# Patient Record
Sex: Male | Born: 1955 | Race: Black or African American | Hispanic: No | Marital: Married | Smoking: Never smoker
Health system: Southern US, Community
[De-identification: ages and names within clinical notes are randomized; demographics above are authoritative.]

## PROBLEM LIST (undated history)

## (undated) DIAGNOSIS — I10 Essential (primary) hypertension: Secondary | ICD-10-CM

## (undated) DIAGNOSIS — J189 Pneumonia, unspecified organism: Secondary | ICD-10-CM

## (undated) DIAGNOSIS — D860 Sarcoidosis of lung: Secondary | ICD-10-CM

## (undated) HISTORY — PX: BACK SURGERY: SHX140

## (undated) HISTORY — DX: Sarcoidosis of lung: D86.0

## (undated) HISTORY — DX: Essential (primary) hypertension: I10

## (undated) HISTORY — PX: EYE SURGERY: SHX253

---

## 2015-11-28 LAB — PULMONARY FUNCTION TEST

## 2016-09-28 ENCOUNTER — Emergency Department (HOSPITAL_COMMUNITY): Payer: 59

## 2016-09-28 ENCOUNTER — Encounter (HOSPITAL_COMMUNITY): Payer: Self-pay | Admitting: *Deleted

## 2016-09-28 ENCOUNTER — Emergency Department (HOSPITAL_COMMUNITY)
Admission: EM | Admit: 2016-09-28 | Discharge: 2016-09-28 | Disposition: A | Discharge status: Home / Self Care | Payer: 59 | Attending: Emergency Medicine | Admitting: Emergency Medicine

## 2016-09-28 DIAGNOSIS — R42 Dizziness and giddiness: Secondary | ICD-10-CM | POA: Diagnosis present

## 2016-09-28 HISTORY — DX: Pneumonia, unspecified organism: J18.9

## 2016-09-28 LAB — URINALYSIS, ROUTINE W REFLEX MICROSCOPIC
BILIRUBIN URINE: NEGATIVE
Glucose, UA: NEGATIVE mg/dL
HGB URINE DIPSTICK: NEGATIVE
KETONES UR: NEGATIVE mg/dL
Leukocytes, UA: NEGATIVE
NITRITE: NEGATIVE
Protein, ur: NEGATIVE mg/dL
SPECIFIC GRAVITY, URINE: 1.004 — AB (ref 1.005–1.030)
pH: 7 (ref 5.0–8.0)

## 2016-09-28 LAB — BASIC METABOLIC PANEL
Anion gap: 7 (ref 5–15)
BUN: 12 mg/dL (ref 6–20)
CALCIUM: 9.5 mg/dL (ref 8.9–10.3)
CHLORIDE: 105 mmol/L (ref 101–111)
CO2: 25 mmol/L (ref 22–32)
CREATININE: 1.34 mg/dL — AB (ref 0.61–1.24)
GFR calc Af Amer: >60 mL/min (ref >60)
GFR calc non Af Amer: 56 mL/min — ABNORMAL LOW (ref >60)
Glucose, Bld: 108 mg/dL — ABNORMAL HIGH (ref 65–99)
Potassium: 4.7 mmol/L (ref 3.5–5.1)
SODIUM: 137 mmol/L (ref 135–145)

## 2016-09-28 LAB — CBC WITH DIFFERENTIAL/PLATELET
BASOS ABS: 0.1 10*3/uL (ref 0.0–0.1)
BASOS PCT: 1 %
EOS ABS: 0.1 10*3/uL (ref 0.0–0.7)
Eosinophils Relative: 2 %
HCT: 43.6 % (ref 39.0–52.0)
HEMOGLOBIN: 13.6 g/dL (ref 13.0–17.0)
LYMPHS ABS: 1.6 10*3/uL (ref 0.7–4.0)
LYMPHS PCT: 27 %
MCH: 22.5 pg — AB (ref 26.0–34.0)
MCHC: 31.2 g/dL (ref 30.0–36.0)
MCV: 72.1 fL — ABNORMAL LOW (ref 78.0–100.0)
MONO ABS: 0.4 10*3/uL (ref 0.1–1.0)
Monocytes Relative: 6 %
NEUTROS ABS: 3.9 10*3/uL (ref 1.7–7.7)
Neutrophils Relative %: 64 %
PLATELETS: 188 10*3/uL (ref 150–400)
RBC: 6.05 MIL/uL — ABNORMAL HIGH (ref 4.22–5.81)
RDW: 14.3 % (ref 11.5–15.5)
WBC: 6.1 10*3/uL (ref 4.0–10.5)

## 2016-09-28 LAB — I-STAT TROPONIN, ED: TROPONIN I, POC: 0.02 ng/mL (ref 0.00–0.08)

## 2016-09-28 MED ORDER — LORAZEPAM 2 MG/ML IJ SOLN
1.0000 mg | Freq: Once | INTRAMUSCULAR | Status: AC
  Administered 2016-09-28: 1 mg via INTRAVENOUS
  Filled 2016-09-28: qty 1

## 2016-09-28 MED ORDER — DIPHENHYDRAMINE HCL 50 MG/ML IJ SOLN
25.0000 mg | Freq: Once | INTRAMUSCULAR | Status: AC
  Administered 2016-09-28: 25 mg via INTRAVENOUS
  Filled 2016-09-28: qty 1

## 2016-09-28 MED ORDER — METOCLOPRAMIDE HCL 5 MG/ML IJ SOLN
10.0000 mg | Freq: Once | INTRAMUSCULAR | Status: AC
  Administered 2016-09-28: 10 mg via INTRAVENOUS
  Filled 2016-09-28: qty 2

## 2016-09-28 MED ORDER — SODIUM CHLORIDE 0.9 % IV BOLUS (SEPSIS)
1000.0000 mL | Freq: Once | INTRAVENOUS | Status: AC
  Administered 2016-09-28: 1000 mL via INTRAVENOUS

## 2016-09-28 MED ORDER — PROCHLORPERAZINE EDISYLATE 5 MG/ML IJ SOLN
10.0000 mg | Freq: Once | INTRAMUSCULAR | Status: AC
  Administered 2016-09-28: 10 mg via INTRAVENOUS
  Filled 2016-09-28: qty 2

## 2016-09-28 MED ORDER — MECLIZINE HCL 25 MG PO TABS
25.0000 mg | ORAL_TABLET | Freq: Once | ORAL | Status: AC
  Administered 2016-09-28: 25 mg via ORAL
  Filled 2016-09-28: qty 1

## 2016-09-28 MED ORDER — MECLIZINE HCL 12.5 MG PO TABS: 12.5000 mg | ORAL_TABLET | Freq: Three times a day (TID) | ORAL | 0 refills | Status: DC | PRN

## 2016-09-28 MED ORDER — ONDANSETRON HCL 4 MG/2ML IJ SOLN
4.0000 mg | Freq: Once | INTRAMUSCULAR | Status: AC
  Administered 2016-09-28: 4 mg via INTRAVENOUS
  Filled 2016-09-28: qty 2

## 2016-09-28 NOTE — Discharge Instructions (Addendum)
Take meclizine as directed for nausea.   Take zofran as directed for nausea/vomiting.  Follow-up with your primary care doctor in 24-48 hours for further evaluation.   Follow-up with referred neurologist for further evaluation.   Return to the Emergency Department for any worsening dizziness, difficulty breathing, chest pain, difficulty walking, abdominal pain, nausea/vomiting or any other worsening or concerning symptoms.

## 2016-09-28 NOTE — ED Notes (Signed)
Patient transported to MRI 

## 2016-09-28 NOTE — ED Provider Notes (Signed)
[facility]-EMERGENCY DEPT Provider Note   CSN: [id number] Arrival date & time: 09/28/16  0754     History   Chief Complaint Chief Complaint  Patient presents with  . Dizziness    HPI Andrew Logan is a 61 y.o. male who presents with dizziness, nausea/vomiting that began and presently 5:30 this morning. Patient states that he woke up to go to the bathroom and when he attempted to ambulate, he felt like the room was spinning. Patient had a stop and lay down on the floor and help with symptoms. Patient states that dizziness was improved if he sat down and remain still and was worsened when he tried to move or ambulate. Patient reports that he started feeling a "uncomfortable sensation from his torso up" followed by an episode of vomiting and diarrhea. Patient reports that emesis was nonbloody and nonbilious. Diarrhea had no presence of blood. Patient denies chest pain preceding onset of symptoms. On ED arrival, patient states that symptoms have improved though he is still expressing some dizziness and "uncomfortable feeling "in his epigastric region. Patient reports that he is also having a slight headache. He states that he has had intermittent headaches for the last month which is abnormal for him. Patient denies any recent abnormal foods or sicknesses. Patient reports that he had been in his normal state of health prior to onset of symptoms. Patient does report that he took 25 mg of trazodone last night at around 12 AM prior to going to sleep. He has taken that before without any symptoms. Patient denies any recent heat exposure. Patient denies any fever or chills, chest pain, difficulty breathing, dysuria, hematuria, facial droop, slurred speech, numbness/weakness of his arms or legs.  The history is provided by the patient.    Past Medical History:  Diagnosis Date  . Pneumonia    greater than 3 years    There are no active problems to display for this patient.   Past Surgical History:    Procedure Laterality Date  . BACK SURGERY    . EYE SURGERY         Home Medications    Prior to Admission medications   Medication Sig Start Date End Date Taking? Authorizing Provider  meclizine (ANTIVERT) 12.5 MG tablet Take 1 tablet (12.5 mg total) by mouth 3 (three) times daily as needed for dizziness. 09/28/16   Maxwell CaulLayden, Vernal Hritz A, PA-C    Family History History reviewed. No pertinent family history.  Social History Social History  Substance Use Topics  . Smoking status: Never Smoker  . Smokeless tobacco: Never Used  . Alcohol use No     Allergies   Patient has no known allergies.   Review of Systems Review of Systems  Constitutional: Negative for chills and fever.  Eyes: Negative for visual disturbance.  Respiratory: Negative for cough and shortness of breath.   Cardiovascular: Negative for chest pain.  Gastrointestinal: Positive for abdominal pain, diarrhea, nausea and vomiting. Negative for blood in stool.  Genitourinary: Negative for dysuria and hematuria.  Neurological: Positive for dizziness and headaches. Negative for facial asymmetry, weakness and numbness.  All other systems reviewed and are negative.    Physical Exam Updated Vital Signs BP 139/74 (BP Location: Right Arm)   Pulse (!) 54   Temp 98.4 F (36.9 C)   Resp 16   SpO2 99%   Physical Exam  Constitutional: He is oriented to person, place, and time. He appears well-developed and well-nourished.  HENT:  Head: Normocephalic and atraumatic.  Mouth/Throat: Oropharynx is clear and moist and mucous membranes are normal.  Eyes: Pupils are equal, round, and reactive to light. Conjunctivae and lids are normal. Right eye exhibits nystagmus. Left eye exhibits nystagmus.  Horizontal nystagmus noted with movement to the right. No vertical or rotational nystagmus.  Neck: Full passive range of motion without pain. Neck supple. No neck rigidity.  Cardiovascular: Normal rate, regular rhythm, normal heart  sounds and normal pulses.  Exam reveals no gallop and no friction rub.   No murmur heard. Pulmonary/Chest: Effort normal and breath sounds normal.  Abdominal: Soft. Normal appearance. There is tenderness in the epigastric area. There is no rigidity and no guarding.  Abdomen soft, nondistended. Diffuse tenderness noted to the epigastric region.  Musculoskeletal: Normal range of motion.  Neurological: He is alert and oriented to person, place, and time. GCS eye subscore is 4. GCS verbal subscore is 5. GCS motor subscore is 6.  Cranial nerves III-XII intact Follows commands, Moves all extremities  5/5 strength to BUE and BLE  Sensation intact throughout all major nerve distributions Normal finger to nose. No dysdiadochokinesia. No pronator drift. No slurred speech. No facial droop.   Skin: Skin is warm and dry. Capillary refill takes less than 2 seconds.  Psychiatric: He has a normal mood and affect. His speech is normal.  Nursing note and vitals reviewed.    ED Treatments / Results  Labs (all labs ordered are listed, but only abnormal results are displayed) Labs Reviewed  BASIC METABOLIC PANEL - Abnormal; Notable for the following:       Result Value   Glucose, Bld 108 (*)    Creatinine, Ser 1.34 (*)    GFR calc non Af Amer 56 (*)    All other components within normal limits  CBC WITH DIFFERENTIAL/PLATELET - Abnormal; Notable for the following:    RBC 6.05 (*)    MCV 72.1 (*)    MCH 22.5 (*)    All other components within normal limits  URINALYSIS, ROUTINE W REFLEX MICROSCOPIC - Abnormal; Notable for the following:    Color, Urine STRAW (*)    Specific Gravity, Urine 1.004 (*)    All other components within normal limits  I-STAT TROPONIN, ED    EKG  EKG Interpretation  Date/Time:  Saturday September 28 2016 08:01:04 EDT Ventricular Rate:  54 PR Interval:    QRS Duration: 93 QT Interval:  440 QTC Calculation: 417 R Axis:   35 Text Interpretation:  Sinus rhythm No old  tracing to compare Confirmed by Melene Plan [phone]) on 09/28/2016 12:27:03 PM       Radiology Ct Head Wo Contrast  Result Date: 09/28/2016 CLINICAL DATA:  Slight headache with dizziness and generalized weakness. Fall without head injury. EXAM: CT HEAD WITHOUT CONTRAST TECHNIQUE: Contiguous axial images were obtained from the base of the skull through the vertex without intravenous contrast. COMPARISON:  None. FINDINGS: Brain: Ventricles, cisterns and other CSF spaces are within normal. There is no mass, mass effect, shift of midline structures or acute hemorrhage. There is mild chronic ischemic microvascular disease. Vascular: No hyperdense vessel or unexpected calcification. Skull: No evidence of fracture. Sinuses/Orbits: No acute finding. Other: None. IMPRESSION: No acute intracranial findings. Minimal chronic ischemic microvascular disease. Electronically Signed   By: Elberta Fortis M.D.   On: 09/28/2016 10:06   Mr Brain Wo Contrast  Result Date: 09/28/2016 CLINICAL DATA:  Dizziness beginning this morning. EXAM: MRI HEAD WITHOUT CONTRAST TECHNIQUE: Multiplanar, multiecho pulse sequences of the brain  and surrounding structures were obtained without intravenous contrast. COMPARISON:  CT head without contrast from the same day. FINDINGS: Brain: The diffusion-weighted images demonstrate no acute or subacute infarction. No acute hemorrhage or mass lesion is present. The ventricles are of normal size. Periventricular and subcortical white matter changes bilaterally are mildly advanced for age. The internal auditory canals are within normal limits bilaterally. The brainstem and cerebellum are normal. Vascular: Flow is present in the major intracranial arteries. Skull and upper cervical spine: The skullbase is within normal limits. Craniocervical junction is normal. Midline sagittal structures are unremarkable. Marrow signal is within normal limits. Sinuses/Orbits: The paranasal sinuses and mastoid air cells are  clear. The globes and orbits are within normal limits. IMPRESSION: 1. No acute intracranial abnormality. 2. Periventricular and subcortical white matter changes bilaterally are mildly advanced for age. The finding is nonspecific but can be seen in the setting of chronic microvascular ischemia, a demyelinating process such as multiple sclerosis, vasculitis, complicated migraine headaches, or as the sequelae of a prior infectious or inflammatory process. Electronically Signed   By: Marin Roberts M.D.   On: 09/28/2016 15:52    Procedures Procedures (including critical care time)  Medications Ordered in ED Medications  ondansetron (ZOFRAN) injection 4 mg (4 mg Intravenous Given 09/28/16 0925)  meclizine (ANTIVERT) tablet 25 mg (25 mg Oral Given 09/28/16 0925)  sodium chloride 0.9 % bolus 1,000 mL (0 mLs Intravenous Stopped 09/28/16 1700)  LORazepam (ATIVAN) injection 1 mg (1 mg Intravenous Given 09/28/16 1259)  diphenhydrAMINE (BENADRYL) injection 25 mg (25 mg Intravenous Given 09/28/16 1356)  metoCLOPramide (REGLAN) injection 10 mg (10 mg Intravenous Given 09/28/16 1358)  prochlorperazine (COMPAZINE) injection 10 mg (10 mg Intravenous Given 09/28/16 1400)     Initial Impression / Assessment and Plan / ED Course  I have reviewed the triage vital signs and the nursing notes.  Pertinent labs & imaging results that were available during my care of the patient were reviewed by me and considered in my medical decision making (see chart for details).     61 year old male who presents with dizziness, nausea/vomiting that began approximate 5:30 this morning. No history of chest pain or to call to breathing. Patient is afebrile, non-toxic appearing, sitting comfortably on examination table. Vital signs reviewed. Initial heart rate was 51. Initial blood pressure is slightly hypertensive. Repeat blood pressure shows improvement into the 130s systolic. No neuro deficits on exam. There is presence of  horizontal nystagmus. Consider vertigo versus dehydration. Low suspicion for CVA. History/physical exam or not concerning for aortic dissection. Also consider intracranial mass given recent history of intermittent headaches and current symptoms. Plan to check basic labs including CBC, BMP, UA, EKG. Will also obtain head CT for evaluation. Plan to check orthostatics. Plan to give IVF, analgesics and antiemetics in the department. Will try meclizine in the department to see if that improves symptoms.   Labs reviewed. Urinalysis is negative for any acute signs of infection. CBC is unremarkable. BMP shows creatinine of 1.34. No priors for comparison. I-STAT troponin is negative. EKG shows normal sinus rhythm. Discussed results with patient. Reevaluation. Patient reports some improvement in dizziness after meclizine but is still having some symptoms. CT head is pending. Orthostatics has not been above.   Orthostatic VS for the past 24 hrs:  BP- Lying Pulse- Lying BP- Sitting Pulse- Sitting BP- Standing at 0 minutes Pulse- Standing at 0 minutes  09/28/16 1032 130/68 53 142/77 58 162/85 61    CT head is negative  for any acute hemorrhage or mass. Discussed results with patient. Patient still feeling some dizziness that he was able to ambulate to the bathroom without difficulty. Will plan to give additional Ativan for further symptomatic control.  Patient ambulated in the department. Patient reports that he does experience some dizziness with ambulation. Initially gait was normal but as patient continues to walk he will veer off to the side when walking. Will plan to order MRI of brain for further evaluation. Will also give additional medications to help with dizziness. Discussed with patient. Patient family agree to plan.  MRI reviewed. Negative for any acute CVA. There is mention of some nonspecific white matter changes that are mildly advanced for age. Plan to give outpatient neurology referral for further  evaluation. Discussed results with patient and family. She reports improvement in dizziness after medication.  Patient ambulated in the department without difficulty or symptoms. Normal gait. No deviation. Normal heel to toe walking. She has been able to drink water without difficulty in the department. Patient is hemodynamically stable. Stable for discharge at this time. Will plan to treat symptomatically. Instructed patient to follow-up with PCP in 2 days. Strict return precautions discussed. Patient expresses understanding and agreement to plan.    Final Clinical Impressions(s) / ED Diagnoses   Final diagnoses:  Dizziness  Vertigo    New Prescriptions Discharge Medication List as of 09/28/2016  4:28 PM    START taking these medications   Details  meclizine (ANTIVERT) 12.5 MG tablet Take 1 tablet (12.5 mg total) by mouth 3 (three) times daily as needed for dizziness., Starting Sat 09/28/2016, Print         Maxwell Caul, PA-C 09/28/16 1725    Melene Plan, DO 09/29/16 [phone]

## 2016-09-28 NOTE — ED Notes (Signed)
Got patient undress on the monitor did ekg shown to dr Adela LankFloyd patient is resting with family at bedside with call bell in reach

## 2016-09-28 NOTE — ED Notes (Signed)
Walked patient to the bathroom got patient urine sample patient is resting with family at bedside call bell in reach

## 2016-09-28 NOTE — ED Triage Notes (Signed)
Pt reports taking 25 mg of  Trazodone after 12 midnight and when he  Woke up at 5:30  And  Was going to BR . Pt felt the room spinning and sat down on the floor. Pt also reported he  Vomited x2 . Pt felt like he was going to have diarrhea but did not. Pt reports he felt like the room was spinning.

## 2016-09-28 NOTE — ED Triage Notes (Signed)
Pt reports he is dizzy . Pt requesting to go to BR. Pt instructed he could not to BR while dizzy. BSC brought to room. Pt continue to ask to go to BR. Familly in room  With Pt while using BSC.

## 2016-10-07 ENCOUNTER — Encounter: Payer: Self-pay | Admitting: Neurology

## 2016-10-10 ENCOUNTER — Ambulatory Visit (INDEPENDENT_AMBULATORY_CARE_PROVIDER_SITE_OTHER): Payer: PRIVATE HEALTH INSURANCE | Admitting: Neurology

## 2016-10-10 ENCOUNTER — Encounter: Payer: Self-pay | Admitting: Neurology

## 2016-10-10 VITALS — BP 122/78 | HR 60 | Ht 73.0 in | Wt 230.0 lb

## 2016-10-10 DIAGNOSIS — R42 Dizziness and giddiness: Secondary | ICD-10-CM

## 2016-10-10 NOTE — Patient Instructions (Signed)
1. Schedule carotid ultrasound 2. Schedule echocardiogram 3. Refer to ENT for vertigo and possible hearing changes 4. Start daily aspirin 81mg  5. Continue to monitor BP, cholesterol, and sugar levels with Dr. Pecola Leisure (goal LDL <70) 6. If symptoms change, go to ER immediately. Follow-up in 6 months, call for any changes

## 2016-10-10 NOTE — Progress Notes (Signed)
NEUROLOGY CONSULTATION NOTE  Andrew Logan MRN: [id number] DOB: [birth year]  Referring provider: Dr. Melene Plan (ER) Primary care provider: Dr. Leilani Able  Reason for consult:  vertigo  Dear Dr Adela Lank:  Thank you for your kind referral of Andrew Logan for consultation of the above symptoms. Although his history is well known to you, please allow me to reiterate it for the purpose of our medical record. Records and images were personally reviewed where available.  HISTORY OF PRESENT ILLNESS: This is a pleasant 61 year old right-handed man with a history of pulmonary sarcoidosis, presenting after ER visit for vertigo. He reports a similar episode of vertigo in 1996 while he was in the doctor's office, he had a briefer episode of spinning that was less intense that the recent episode. On 09/28/16, he woke up to go to the bathroom, and as he tried to get up, the room was going in circles. He had to stop and lay on the floor for 15 minutes, and noticed that if remained still, dizziness got better. He felt an uncomfortable sensation from his abdomen going up the torso, crawled to the commode where he had a small amount of loose stool, then started vomiting. Symptoms lasted 30 minutes, he was not so much spinning when he got to the ER, but felt that his balance was not good. No speech difficulties or confusion. When he got home, he went to bed, and woke up the next morning feeling almost back to normal, able to drive all the way to [place]. He reports that he had an intense week prior, and a long day the day before. In the ER, exam had shown horizontal nystagmus with movement to the right, no focal abnormalities. Bloodwork was normal, EKG showed sinus rhythm. He was given meclizine, Benadryl, Reglan, and Compazine. He had an MRI brain without contrast with no acute changes. It was noted that there were white matter changes mildly advanced for age.   He denies any prior history of headaches, but over the  past year, he has been having occasional brief sharp mild headaches that dissipate quickly, over the frontal or occipital regions, no associated nausea/vomiting, photo/phonophobia. He denies any tinnitus or ear pressure, but sometimes wonders about hearing loss. He denies any diplopia, dysarthria/dysphagia, neck pain, focal numbness/tingling/weakness, bowel/bladder dysfunction. He has some low back pain.   I personally reviewed MRI brain without contrast which did not show any acute infarct. There was note of mild periventricular and white matter FLAIR changes bilaterally.  PAST MEDICAL HISTORY: Past Medical History:  Diagnosis Date  . Pneumonia    greater than 3 years    PAST SURGICAL HISTORY: Past Surgical History:  Procedure Laterality Date  . BACK SURGERY    . EYE SURGERY      MEDICATIONS: Current Outpatient Prescriptions on File Prior to Visit  Medication Sig Dispense Refill  . meclizine (ANTIVERT) 12.5 MG tablet Take 1 tablet (12.5 mg total) by mouth 3 (three) times daily as needed for dizziness. 30 tablet 0   No current facility-administered medications on file prior to visit.     ALLERGIES: No Known Allergies  FAMILY HISTORY: No family history on file.  SOCIAL HISTORY: Social History   Social History  . Marital status: Married    Spouse name: N/A  . Number of children: N/A  . Years of education: N/A   Occupational History  . Not on file.   Social History Main Topics  . Smoking status: Never Smoker  .  Smokeless tobacco: Never Used  . Alcohol use No  . Drug use: No  . Sexual activity: Not on file   Other Topics Concern  . Not on file   Social History Narrative  . No narrative on file    REVIEW OF SYSTEMS: Constitutional: No fevers, chills, or sweats, no generalized fatigue, change in appetite Eyes: No visual changes, double vision, eye pain Ear, nose and throat: No hearing loss, ear pain, nasal congestion, sore throat Cardiovascular: No chest pain,  palpitations Respiratory:  No shortness of breath at rest or with exertion, wheezes GastrointestinaI: No nausea, vomiting, diarrhea, abdominal pain, fecal incontinence Genitourinary:  No dysuria, urinary retention or frequency Musculoskeletal:  No neck pain, +back pain Integumentary: No rash, pruritus, skin lesions Neurological: as above Psychiatric: No depression, insomnia, anxiety Endocrine: No palpitations, fatigue, diaphoresis, mood swings, change in appetite, change in weight, increased thirst Hematologic/Lymphatic:  No anemia, purpura, petechiae. Allergic/Immunologic: no itchy/runny eyes, nasal congestion, recent allergic reactions, rashes  PHYSICAL EXAM: Vitals:   10/10/16 0911  BP: 122/78  Pulse: 60  SpO2: 96%   General: No acute distress Head:  Normocephalic/atraumatic Eyes: Fundoscopic exam shows bilateral sharp discs, no vessel changes, exudates, or hemorrhages Neck: supple, no paraspinal tenderness, full range of motion Back: No paraspinal tenderness Heart: regular rate and rhythm Lungs: Clear to auscultation bilaterally. Vascular: No carotid bruits. Skin/Extremities: No rash, no edema Neurological Exam: Mental status: alert and oriented to person, place, and time, no dysarthria or aphasia, Fund of knowledge is appropriate.  Recent and remote memory are intact.  Attention and concentration are normal.    Able to name objects and repeat phrases. Cranial nerves: CN I: not tested CN II: pupils equal, round and reactive to light, visual fields intact, fundi unremarkable. CN III, IV, VI:  full range of motion, no nystagmus, no ptosis CN V: facial sensation intact CN VII: upper and lower face symmetric CN VIII: hearing intact to finger rub CN IX, X: gag intact, uvula midline CN XI: sternocleidomastoid and trapezius muscles intact CN XII: tongue midline Bulk & Tone: normal, no fasciculations. Motor: 5/5 throughout with no pronator drift. Sensation: intact to light  touch, cold, pin, vibration and joint position sense.  No extinction to double simultaneous stimulation.  Romberg test negative Deep Tendon Reflexes: +2 throughout, no ankle clonus Plantar responses: downgoing bilaterally Cerebellar: no incoordination on finger to nose, heel to shin. No dysdiadochokinesia Gait: narrow-based and steady, able to tandem walk adequately. Tremor: none  IMPRESSION: This is a pleasant 61 year old right-handed man with a history of pulmonary sarcoidosis, presenting after a transient episode of vertigo with vomiting last 09/28/2016. Symptoms had resolved after 30 minutes, but he continued to feel some imbalance until he went to bed. MRI brain in the ER no acute changes. There was note of white matter changes bilaterally that appears due to chronic microvascular disease, however he denies any vascular risk factors, BP today 122/78. We discussed different causes of vertigo, majority of cases it is due to peripheral vertigo, but in this age group, posterior circulation TIA is also considered. TIA workup with carotid dopplers and echocardiogram will be ordered. He was advised to start a daily baby aspirin, and continue to monitor blood pressure, cholesterol (goal LDL <70), and glucose levels with PCP. He is also reporting some hearing changes, with vertigo, would refer to ENT for further evaluation as well. He will follow-up in 6 months and knows to go to the ER for any significant changes in  symptoms.   Thank you for allowing me to participate in the care of this patient. Please do not hesitate to call for any questions or concerns.   Patrcia Dolly, M.D.  CC: Dr. Pecola Leisure

## 2016-10-15 ENCOUNTER — Ambulatory Visit (HOSPITAL_COMMUNITY): Payer: PRIVATE HEALTH INSURANCE | Attending: Cardiology

## 2016-10-15 ENCOUNTER — Other Ambulatory Visit: Payer: Self-pay

## 2016-10-15 ENCOUNTER — Telehealth: Payer: Self-pay | Admitting: Neurology

## 2016-10-15 DIAGNOSIS — Z8673 Personal history of transient ischemic attack (TIA), and cerebral infarction without residual deficits: Secondary | ICD-10-CM | POA: Diagnosis not present

## 2016-10-15 DIAGNOSIS — R42 Dizziness and giddiness: Secondary | ICD-10-CM | POA: Insufficient documentation

## 2016-10-15 DIAGNOSIS — D860 Sarcoidosis of lung: Secondary | ICD-10-CM | POA: Insufficient documentation

## 2016-10-15 NOTE — Telephone Encounter (Signed)
Patient wants to check the status of the ENT referral please call

## 2016-10-15 NOTE — Telephone Encounter (Signed)
Referral placed again.

## 2016-10-16 ENCOUNTER — Telehealth: Payer: Self-pay | Admitting: Neurology

## 2016-10-16 DIAGNOSIS — R42 Dizziness and giddiness: Secondary | ICD-10-CM | POA: Insufficient documentation

## 2016-10-16 NOTE — Telephone Encounter (Signed)
Noted  

## 2016-10-16 NOTE — Telephone Encounter (Signed)
Dr Lajoyce Lauber office called to let you know PT is scheduled with Dr Joneen Roach on 10/28/2016 at 9:30

## 2016-10-17 ENCOUNTER — Other Ambulatory Visit: Payer: Self-pay

## 2016-10-17 ENCOUNTER — Telehealth: Payer: Self-pay

## 2016-10-17 DIAGNOSIS — R42 Dizziness and giddiness: Secondary | ICD-10-CM

## 2016-10-17 NOTE — Telephone Encounter (Signed)
Spoke with pt relaying message below.    He is interested in Dr. Rosalyn GessAquino's recommendations for Pulmonology?

## 2016-10-17 NOTE — Telephone Encounter (Signed)
-----   Message from Van ClinesKaren M Aquino, MD sent at 10/16/2016  1:08 PM EDT ----- Pls let him know heart looks good, thanks

## 2016-10-18 NOTE — Telephone Encounter (Signed)
If he is okay with going to [facility], any of them are good. Thanks

## 2016-10-18 NOTE — Telephone Encounter (Signed)
Spoke with pt relaying message below.   

## 2016-11-26 ENCOUNTER — Ambulatory Visit (INDEPENDENT_AMBULATORY_CARE_PROVIDER_SITE_OTHER)
Admission: RE | Admit: 2016-11-26 | Discharge: 2016-11-26 | Disposition: A | Discharge status: Home / Self Care | Payer: PRIVATE HEALTH INSURANCE | Source: Ambulatory Visit | Attending: Internal Medicine | Admitting: Internal Medicine

## 2016-11-26 ENCOUNTER — Encounter: Payer: Self-pay | Admitting: Internal Medicine

## 2016-11-26 ENCOUNTER — Ambulatory Visit (INDEPENDENT_AMBULATORY_CARE_PROVIDER_SITE_OTHER): Payer: PRIVATE HEALTH INSURANCE | Admitting: Internal Medicine

## 2016-11-26 VITALS — BP 122/90 | HR 55 | Ht 73.0 in | Wt 230.0 lb

## 2016-11-26 DIAGNOSIS — R05 Cough: Secondary | ICD-10-CM

## 2016-11-26 DIAGNOSIS — D860 Sarcoidosis of lung: Secondary | ICD-10-CM

## 2016-11-26 DIAGNOSIS — R058 Other specified cough: Secondary | ICD-10-CM

## 2016-11-26 MED ORDER — FAMOTIDINE 20 MG PO TABS: ORAL_TABLET | ORAL | Status: AC

## 2016-11-26 NOTE — Patient Instructions (Addendum)
Start pepcid 20 mg at bedtime until return to see if any of your morning congestion is due to silent noctural reflux   If am congestion is not better after 2 weeks > add to Pepcid >>>> CHLORPHENIRAMINE  4 mg - take one every 4 hours as needed - available over the counter- may cause drowsiness so start with just a bedtime dose or two    GERD (REFLUX)  is an extremely common cause of respiratory symptoms just like yours , many times with no obvious heartburn at all.    It can be treated with medication, but also with lifestyle changes including elevation of the head of your bed (ideally with 6 inch  bed blocks),  Smoking cessation, avoidance of late meals, excessive alcohol, and avoid fatty foods, chocolate, peppermint, colas, red wine, and acidic juices such as orange juice.  NO MINT OR MENTHOL PRODUCTS SO NO COUGH DROPS   USE SUGARLESS CANDY INSTEAD (Jolley ranchers or Stover's or Life Savers) or even ice chips will also do - the key is to swallow to prevent all throat clearing. NO OIL BASED VITAMINS - use powdered substitutes.   Please remember to go to the  x-ray department downstairs in the basement  for your tests - we will call you with the results when they are available.      Please schedule a follow up office visit in 4 weeks, sooner if needed with pfts.

## 2016-11-26 NOTE — Progress Notes (Signed)
Subjective:     Patient ID: Andrew Logan, male   DOB: June 11, 1955,   MRN: [id number]  HPI  58 yobm never smoker late 1990s developed cp while living in [place] > w/u with mediastinoscopy Pos for Sarcoid x 6 months prednisone  Though cp continued off and on during that time and was not related to pred dose used, then  Early 2000's developed breathing problems ? "fluid buiding up" seemed better p prednisone though required at least one thoracentesis ? On L side  then in 2006 living [place] wt loss/ fatigue > admit and rx mtx sev months at most  > better by 2007 and no flare off mtx but no chronic rx since then so referred to pulmonary clinic 11/26/2016 by Dr   Pecola Leisure  With cp and cough    11/26/2016 1st [facility] office visit/ Andrew Logan   Chief Complaint  Patient presents with  . Pulmonary Consult    Referred by Dr. Haskel Schroeder. 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 He c/o occ CP and cough in the am's with white to clear sputum.    onset of sensation of chest congestion/ cough x 10 y esp the last 3 y esp in am year round and assoc watery rhinitis  Does teadmill  30 min 3 x week @ 3.7 mph, no grade no sob Midline ant Cp is still comes and goes x once a month maybe lasts one minute not brought on by cough or ex and no assoc with meals he's aware of or assoc n or v or diaphoresis Cough congestion worse when using voice a lot or stressed/ ?  better on symbicort though does not take as maint    No obvious day to day or daytime variability or assoc excess/ purulent sputum or mucus plugs or hemoptysis or  chest tightness, subjective wheeze or overt hb symptoms. No unusual exp hx or h/o childhood pna/ asthma or knowledge of premature birth.  Sleeping ok on sides  without nocturnal  or early am exacerbation  of respiratory  c/o's or need for noct saba. Also denies any obvious fluctuation of symptoms with weather or environmental changes or other aggravating or  alleviating factors except as outlined above   Current Allergies, Complete Past Medical History, Past Surgical History, Family History, and Social History were reviewed in [employer] record.  ROS  The following are not active complaints unless bolded Hoarseness, sore throat, dysphagia, dental problems, itching, sneezing,  nasal congestion or discharge of excess mucus or purulent secretions, ear ache,   fever, chills, sweats, unintended wt loss or wt gain, classically pleuritic or exertional cp,  orthopnea pnd or leg swelling, presyncope, palpitations, abdominal pain, anorexia, nausea, vomiting, diarrhea  or change in bowel habits or change in bladder habits, change in stools or change in urine, dysuria, hematuria,  rash, arthralgias, visual complaints, headache, numbness, weakness or ataxia or problems with walking or coordination,  change in mood/affect or memory.        Current Meds  Medication Sig  . budesonide-formoterol (SYMBICORT) 160-4.5 MCG/ACT inhaler Inhale 2 puffs into the lungs 2 (two) times daily as needed.  . fluticasone (FLONASE) 50 MCG/ACT nasal spray Place 2 sprays into both nostrils daily as needed for allergies or rhinitis.                  Review of Systems     Objective:   Physical Exam  amb  bm nad   Wt Readings from Last 3 Encounters:  11/26/16 230 lb (104.3 kg)  10/10/16 230 lb (104.3 kg)    Vital signs reviewed   - Note on arrival 02 sats  99% on RA      HEENT: nl dentition, and oropharynx. Nl external ear canals without cough reflex- moderate bilateral non-specific turbinate edema     NECK :  without JVD/Nodes/TM/ nl carotid upstrokes bilaterally   LUNGS: no acc muscle use,  Nl contour chest which is clear to A and P bilaterally without cough on insp or exp maneuvers   CV:  RRR  no s3 or murmur or increase in P2, and no edema   ABD:  soft and nontender with nl inspiratory excursion in the supine position. No bruits or  organomegaly appreciated, bowel sounds nl  MS:  Nl gait/ ext warm without deformities, calf tenderness, cyanosis or clubbing No obvious joint restrictions   SKIN: warm and dry without lesions    NEURO:  alert, approp, nl sensorium with  no motor or cerebellar deficits apparent.     CXR PA and Lateral:   11/26/2016 :    I personally reviewed images and   impression as follows:   C/w burned out sarcoid though can't rule out active dz       Assessment:

## 2016-11-27 ENCOUNTER — Telehealth: Payer: Self-pay | Admitting: Internal Medicine

## 2016-11-27 DIAGNOSIS — R058 Other specified cough: Secondary | ICD-10-CM | POA: Insufficient documentation

## 2016-11-27 DIAGNOSIS — R05 Cough: Secondary | ICD-10-CM | POA: Insufficient documentation

## 2016-11-27 NOTE — Assessment & Plan Note (Signed)
Dx lated [id number] [facility] and complicated by ? Pleural effusions?  - maint pred stopped 2002?  - mtx for wt loss/ fatigue but only reported using for a few months PFT's 11/28/15 FVC  3.25 (66%) with dlco 84% and no obst / nl f/v curves   A good rule of thumb is that >95% of pts with active sarcoid in any organ will have some plain cxr changes as he clearly does -  on the other hand  if there are active pulmonary symptoms the cxr will look much worse than the patient:  No evidence of the latter scenario here/ strongly doubt active dz but will bring back in for f/u pfts for apples to apples comparison to previous  Total time devoted to counseling  > 50 % of initial 60 min office visit:  review extensive case hx dating back 20 years  with pt/ discussion of options/alternatives/ personally creating written customized instructions  in presence of pt  then going over those specific  Instructions directly with the pt including how to use all of the meds but in particular covering each new medication in detail and the difference between the maintenance= "automatic" meds and the prns using an action plan format for the latter (If this problem/symptom => do that organization reading Left to right).  Please see AVS from this visit for a full list of these instructions which I personally wrote for this pt and  are unique to this visit.

## 2016-11-27 NOTE — Assessment & Plan Note (Signed)
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   These are the same pts (now being labeled as having "irritable larynx syndrome" by some cough centers) who not infrequently have a history of having failed to tolerate ace inhibitors,  dry powder inhalers (and sometimes even hfa ICS like symbiocrt/ esp in high doses which he is on) or biphosphonates or report having atypical/extraesophageal reflux symptoms like atypical cp that don't respond to standard doses of PPI  and are easily confused as having aecopd or asthma flares by even experienced allergists/ pulmonologists (myself included).   rec max rx for gerd and pnds with 1st gen H1 blockers per guidelines  And only use symbicort if symptoms flare while on it.   No role here at all for systemic steroids for any reason   see avs for instructions unique to this ov

## 2016-11-27 NOTE — Telephone Encounter (Signed)
Spoke with patient. Advised that MW wanted him to start on pepcid, which is over the counter. Patient verbalized understanding. Nothing else needed at time of call.

## 2016-12-09 ENCOUNTER — Ambulatory Visit: Payer: PRIVATE HEALTH INSURANCE | Admitting: Neurology

## 2017-01-14 ENCOUNTER — Ambulatory Visit: Payer: PRIVATE HEALTH INSURANCE | Admitting: Internal Medicine

## 2017-02-05 ENCOUNTER — Encounter: Payer: Self-pay | Admitting: Internal Medicine

## 2017-02-10 ENCOUNTER — Ambulatory Visit: Payer: PRIVATE HEALTH INSURANCE | Admitting: Internal Medicine

## 2017-03-04 ENCOUNTER — Ambulatory Visit: Payer: 59 | Admitting: Internal Medicine

## 2017-03-04 ENCOUNTER — Encounter: Payer: Self-pay | Admitting: Internal Medicine

## 2017-03-04 ENCOUNTER — Ambulatory Visit (INDEPENDENT_AMBULATORY_CARE_PROVIDER_SITE_OTHER): Payer: 59 | Admitting: Internal Medicine

## 2017-03-04 ENCOUNTER — Other Ambulatory Visit (INDEPENDENT_AMBULATORY_CARE_PROVIDER_SITE_OTHER): Payer: 59

## 2017-03-04 VITALS — BP 128/80 | HR 66 | Ht 72.0 in | Wt 228.0 lb

## 2017-03-04 DIAGNOSIS — R05 Cough: Secondary | ICD-10-CM

## 2017-03-04 DIAGNOSIS — D860 Sarcoidosis of lung: Secondary | ICD-10-CM

## 2017-03-04 DIAGNOSIS — R058 Other specified cough: Secondary | ICD-10-CM

## 2017-03-04 DIAGNOSIS — J45991 Cough variant asthma: Secondary | ICD-10-CM

## 2017-03-04 LAB — PULMONARY FUNCTION TEST
DL/VA % PRED: 123 %
DL/VA: 5.82 ml/min/mmHg/L
DLCO COR % PRED: 71 %
DLCO cor: 24.97 ml/min/mmHg
DLCO unc % pred: 69 %
DLCO unc: 24.38 ml/min/mmHg
FEF 25-75 POST: 2.53 L/s
FEF 25-75 Pre: 1.83 L/sec
FEF2575-%CHANGE-POST: 38 %
FEF2575-%Pred-Post: 82 %
FEF2575-%Pred-Pre: 59 %
FEV1-%Change-Post: 10 %
FEV1-%Pred-Post: 71 %
FEV1-%Pred-Pre: 64 %
FEV1-POST: 2.41 L
FEV1-Pre: 2.18 L
FEV1FVC-%CHANGE-POST: 7 %
FEV1FVC-%Pred-Pre: 98 %
FEV6-%Change-Post: 2 %
FEV6-%PRED-PRE: 68 %
FEV6-%Pred-Post: 70 %
FEV6-PRE: 2.83 L
FEV6-Post: 2.91 L
FEV6FVC-%Pred-Post: 104 %
FEV6FVC-%Pred-Pre: 104 %
FVC-%CHANGE-POST: 2 %
FVC-%PRED-POST: 67 %
FVC-%Pred-Pre: 65 %
FVC-POST: 2.91 L
FVC-Pre: 2.83 L
POST FEV6/FVC RATIO: 100 %
PRE FEV1/FVC RATIO: 77 %
Post FEV1/FVC ratio: 83 %
Pre FEV6/FVC Ratio: 100 %
RV % pred: 66 %
RV: 1.57 L
TLC % PRED: 60 %
TLC: 4.49 L

## 2017-03-04 LAB — CBC WITH DIFFERENTIAL/PLATELET
Basophils Absolute: 0.1 10*3/uL (ref 0.0–0.1)
Basophils Relative: 0.9 % (ref 0.0–3.0)
EOS PCT: 4.8 % (ref 0.0–5.0)
Eosinophils Absolute: 0.3 10*3/uL (ref 0.0–0.7)
HEMATOCRIT: 42.1 % (ref 39.0–52.0)
Hemoglobin: 13 g/dL (ref 13.0–17.0)
LYMPHS ABS: 2.3 10*3/uL (ref 0.7–4.0)
LYMPHS PCT: 37.7 % (ref 12.0–46.0)
MCHC: 30.9 g/dL (ref 30.0–36.0)
MCV: 74.9 fl — AB (ref 78.0–100.0)
MONOS PCT: 9 % (ref 3.0–12.0)
Monocytes Absolute: 0.6 10*3/uL (ref 0.1–1.0)
NEUTROS ABS: 2.9 10*3/uL (ref 1.4–7.7)
NEUTROS PCT: 47.6 % (ref 43.0–77.0)
PLATELETS: 222 10*3/uL (ref 150.0–400.0)
RBC: 5.62 Mil/uL (ref 4.22–5.81)
RDW: 14.3 % (ref 11.5–15.5)
WBC: 6.1 10*3/uL (ref 4.0–10.5)

## 2017-03-04 MED ORDER — MOMETASONE FURO-FORMOTEROL FUM 100-5 MCG/ACT IN AERO: 2.0000 | INHALATION_SPRAY | Freq: Two times a day (BID) | RESPIRATORY_TRACT | 0 refills | Status: DC

## 2017-03-04 MED ORDER — MOMETASONE FURO-FORMOTEROL FUM 100-5 MCG/ACT IN AERO: 2.0000 | INHALATION_SPRAY | Freq: Two times a day (BID) | RESPIRATORY_TRACT | 11 refills | Status: DC

## 2017-03-04 NOTE — Progress Notes (Addendum)
Subjective:     Patient ID: Andrew Logan, male   DOB: 1955/12/04,   MRN: [id number]    History of Present Illness  22 yobm never smoker late 1990s developed cp while living in [place] > w/u with mediastinoscopy Pos for Sarcoid x 6 months prednisone  Though cp continued off and on during that time and was not related to pred dose used, then  Early 2000's developed breathing problems ? "fluid buiding up" seemed better p prednisone though required at least one thoracentesis ? On L side  then in 2006 living [place] wt loss/ fatigue > admit and  Dx sarcoid rx mtx sev months at most  > better by 2007 and no flare off mtx ? How long took it but  no chronic rx since then so referred to pulmonary clinic 11/26/2016 by Dr   Pecola Leisure  With  Main c/o cough x 2008 but worse since 2015 assoc with sense of chest congestions s excess mucus typical of uacs.   History of Present Illness  11/26/2016 1st [facility] office visit/ Andrew Logan   Chief Complaint  Patient presents with  . Pulmonary Consult    Referred by Dr. Haskel Schroeder. 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 He c/o occ CP and cough in the am's with white to clear sputum.    onset of sensation of chest congestion/ cough x 10 y esp the last 3 y esp in am year round and assoc watery rhinitis Does teadmill  30 min 3 x week @ 3.7 mph, no grade no sob Midline ant Cp is still comes and goes x once a month maybe lasts one minute not brought on by cough or ex and no assoc with meals he's aware of or assoc n or v or diaphoresis Cough congestion worse when using voice a lot or stressed/ ?  better on symbicort though does not take as maint  rec Start pepcid 20 mg at bedtime until return to see if any of your morning congestion is due to silent noctural reflux If am congestion is not better after 2 weeks > add to Pepcid >>>> CHLORPHENIRAMINE  4 mg - take one every 4 hours as needed - available over the counter- may cause  drowsiness so start with just a bedtime dose or two  GERD diet      03/04/2017  f/u ov/Andrew Logan re: cough x 10 y c/w uacs/  Sarcoid hx  Chief Complaint  Patient presents with  . Follow-up    PFT's done today. He states he developed a cold back in Dec 2018 and he has had some cough since then.  His cough is prod with yellow sputum.  He has been using the Symbicort 4-5 x per wk.     using symb just at hs but hfa very poor/ only uses 4-5 hs /week/ never started the h1 as rec and cough is mainly bothering him at hs  Not limited by breathing from desired activities    No obvious day to day or daytime variability or assoc excess/ purulent sputum or mucus plugs or hemoptysis or cp or chest tightness, subjective wheeze or overt sinus or hb symptoms. No unusual exposure hx or h/o childhood pna/ asthma or knowledge of premature birth.   . Also denies any obvious fluctuation of symptoms with weather or environmental changes or other aggravating or alleviating factors except as outlined above   Current Allergies, Complete Past Medical History, Past Surgical  History, Family History, and Social History were reviewed in [facility] electronic medical record.  ROS  The following are not active complaints unless bolded Hoarseness, sore throat, dysphagia, dental problems, itching, sneezing,  nasal congestion or discharge of excess mucus or purulent secretions, ear ache,   fever, chills, sweats, unintended wt loss or wt gain, classically pleuritic or exertional cp,  orthopnea pnd or leg swelling, presyncope, palpitations, abdominal pain, anorexia, nausea, vomiting, diarrhea  or change in bowel habits or change in bladder habits, change in stools or change in urine, dysuria, hematuria,  rash, arthralgias, visual complaints, headache, numbness, weakness or ataxia or problems with walking or coordination,  change in mood/affect or memory.        Current Meds  Medication Sig  . Cholecalciferol (VITAMIN D PO) Take  1 capsule by mouth daily.  . Cyanocobalamin (B-12 PO) Take 1 tablet by mouth daily.  . famotidine (PEPCID) 20 MG tablet One at bedtime  . fluticasone (FLONASE) 50 MCG/ACT nasal spray Place 2 sprays into both nostrils daily as needed for allergies or rhinitis.  . Multiple Vitamins-Minerals (MENS 50+ ADVANCED PO) Take 1 capsule by mouth daily.  . TURMERIC PO Take 1 capsule by mouth daily.  .   budesonide-formoterol (SYMBICORT) 160-4.5 MCG/ACT inhaler Inhale 2 puffs into the lungs 2 (two) times daily as needed.  .   Omega-3 1000 MG CAPS Take 1 capsule by mouth daily.               Objective:   Physical Exam  Pleasant amb bm nad    03/04/2017       228   11/26/16 230 lb (104.3 kg)  10/10/16 230 lb (104.3 kg)      Vital signs reviewed - Note on arrival 02 sats  96% on RA           HEENT: nl dentition,  and oropharynx. Nl external ear canals without cough reflex - mild bilateral non-specific turbinate edema     NECK :  without JVD/Nodes/TM/ nl carotid upstrokes bilaterally   LUNGS: no acc muscle use,  Nl contour chest which is clear to A and P bilaterally without cough on insp or exp maneuvers   CV:  RRR  no s3 or murmur or increase in P2, and no edema   ABD:  soft and nontender with nl inspiratory excursion in the supine position. No bruits or organomegaly appreciated, bowel sounds nl  MS:  Nl gait/ ext warm without deformities, calf tenderness, cyanosis or clubbing No obvious joint restrictions   SKIN: warm and dry without lesions    NEURO:  alert, approp, nl sensorium with  no motor or cerebellar deficits apparent.          Labs ordered 03/04/2017  Allergy profile          Assessment:

## 2017-03-04 NOTE — Patient Instructions (Addendum)
Continue  pepcid 20 mg at bedtime until return to see if any of your morning congestion is due to silent noctural reflux     Add  CHLORPHENIRAMINE  4 mg - take one every 4 hours as needed - available over the counter- may cause drowsiness so start with just a bedtime dose or two to see if helps your am congestions   If not better the next to add prilosec 20 mg Take 30-60 min before first meal of the day until return   Ok to take symbicort or dulera 100 2 pffs at bedtime if you really feel you can't do without it but Work on inhaler technique:  relax and gently blow all the way out then take a nice smooth deep breath back in, triggering the inhaler at same time you start breathing in.  Hold for up to 5 seconds if you can. Blow out thru nose. Rinse and gargle with water when done       GERD (REFLUX)  is an extremely common cause of respiratory symptoms just like yours , many times with no obvious heartburn at all.    It can be treated with medication, but also with lifestyle changes including elevation of the head of your bed (ideally with 6 inch  bed blocks),  Smoking cessation, avoidance of late meals, excessive alcohol, and avoid fatty foods, chocolate, peppermint, colas, red wine, and acidic juices such as orange juice.  NO MINT OR MENTHOL PRODUCTS SO NO COUGH DROPS   USE SUGARLESS CANDY INSTEAD (Jolley ranchers or Stover's or Life Savers) or even ice chips will also do - the key is to swallow to prevent all throat clearing. NO OIL BASED VITAMINS - use powdered substitutes - stop fish oil and eat more fish  Please remember to go to the lab department downstairs in the basement  for your tests - we will call you with the results when they are available.        Please schedule a follow up office visit in 4 weeks, sooner if needed  with all medications /inhalers/ solutions in hand so we can verify exactly what you are taking. This includes all medications from all doctors and over the  counters

## 2017-03-04 NOTE — Progress Notes (Signed)
PFT completed today 03/04/17  

## 2017-03-05 ENCOUNTER — Encounter: Payer: Self-pay | Admitting: Internal Medicine

## 2017-03-05 DIAGNOSIS — J45991 Cough variant asthma: Secondary | ICD-10-CM | POA: Insufficient documentation

## 2017-03-05 LAB — RESPIRATORY ALLERGY PROFILE REGION II ~~LOC~~
Allergen, A. alternata, m6: <0.1 kU/L
Allergen, Comm Silver Birch, t9: <0.1 kU/L
Allergen, D pternoyssinus,d7: <0.1 kU/L
Allergen, Mouse Urine Protein, e78: <0.1 kU/L
Allergen, Oak,t7: <0.1 kU/L
Aspergillus fumigatus, m3: <0.1 kU/L
Bermuda Grass: <0.1 kU/L
Box Elder IgE: <0.1 kU/L
CLASS: 0
CLASS: 0
CLASS: 0
CLASS: 0
CLASS: 0
CLASS: 0
CLASS: 0
CLASS: 0
CLASS: 0
CLASS: 0
COMMON RAGWEED (SHORT) (W1) IGE: <0.1 kU/L
Class: 0
Class: 0
Class: 0
Class: 0
Class: 0
Class: 0
Class: 0
Class: 0
Class: 0
Class: 0
Class: 0
Class: 0
Class: 0
Class: 0
Cockroach: <0.1 kU/L
D. farinae: <0.1 kU/L
Elm IgE: <0.1 kU/L
IgE (Immunoglobulin E), Serum: 37 kU/L (ref <=114)
Johnson Grass: <0.1 kU/L
Pecan/Hickory Tree IgE: <0.1 kU/L
Sheep Sorrel IgE: <0.1 kU/L
Timothy Grass: <0.1 kU/L

## 2017-03-05 LAB — INTERPRETATION:

## 2017-03-05 NOTE — Assessment & Plan Note (Addendum)
FENO 03/04/2017  =   Could not do - 03/04/2017  After extensive coaching inhaler device  effectiveness =    50% from a baseline of  0   rec  try dulera 100 2 bid if helps any of his symptoms and return with 4 weeks to consider MCT next to sort out if this is cough variant asthma or just all uacs (see separate a/p)    I had an extended discussion with the patient reviewing all relevant studies completed to date and  lasting 15 to 20 minutes of a 25 minute visit    Each maintenance medication was reviewed in detail including most importantly the difference between maintenance and prns and under what circumstances the prns are to be triggered using an action plan format that is not reflected in the computer generated alphabetically organized AVS.    Please see AVS for specific instructions unique to this visit that I personally wrote and verbalized to the the pt in detail and then reviewed with pt  by my nurse highlighting any  changes in therapy recommended at today's visit to their plan of care.

## 2017-03-05 NOTE — Assessment & Plan Note (Signed)
Sinus MRI  09/28/16 Sinuses/Orbits: The paranasal sinuses and mastoid air cells are clear.  Trial of gerd rx  11/26/2016 >>> some better 03/04/2017 - add 1st gen H1 blockers per guidelines  03/04/2017  - FENO 03/04/2017  =   Could not do  - Allergy profile 03/04/2017 >  Eos 0.3/  IgE pending   Lack of cough resolution on a verified empirical regimen(not able to confirm yet as did not follow recs)  could mean an alternative diagnosis, persistence of the disease state (eg sinusitis or bronchiectasis) , or inadequacy of currently available therapy (eg no medical rx available for non-acid gerd)   Still favor Upper airway cough syndrome (previously labeled PNDS),  is so named because it's frequently impossible to sort out how much is  CR/sinusitis with freq throat clearing (which can be related to primary GERD)   vs  causing  secondary (" extra esophageal")  GERD from wide swings in gastric pressure that occur with throat clearing, often  promoting self use of mint and menthol lozenges that reduce the lower esophageal sphincter tone and exacerbate the problem further in a cyclical fashion.   These are the same pts (now being labeled as having "irritable larynx syndrome" by some cough centers) who not infrequently have a history of having failed to tolerate ace inhibitors,  dry powder inhalers or biphosphonates or report having atypical/extraesophageal reflux symptoms that don't respond to standard doses of PPI  and are easily confused as having aecopd or asthma flares by even experienced allergists/ pulmonologists (myself included).  rec max rx for gerd/ add 1st gen H1 blockers per guidelines  And regroup in 4 weeks

## 2017-03-05 NOTE — Assessment & Plan Note (Signed)
Dx lated [phone] [facility] and complicated by ? Pleural effusions?  - maint pred stopped 2002?  - mtx for wt loss/ fatigue but only reported using for a few months around 2007  PFT's 11/28/15 FVC  3.25 (66%) with dlco 84% and no obst / nl f/v curves   He could have airways dz / cough variant asthma from sarcoid - see sep a/p but def no evidence of enough dz to warrant systemic pred at present  Discussed in detail all the  indications, usual  risks and alternatives  relative to the benefits with patient who agrees to proceed with conservative f/u as outlined

## 2017-03-06 NOTE — Progress Notes (Signed)
LMTCB

## 2017-03-13 ENCOUNTER — Ambulatory Visit: Payer: PRIVATE HEALTH INSURANCE | Admitting: Audiology

## 2017-03-20 ENCOUNTER — Ambulatory Visit: Payer: PRIVATE HEALTH INSURANCE | Admitting: Neurology

## 2017-03-24 ENCOUNTER — Ambulatory Visit: Payer: 59 | Attending: Audiology | Admitting: Audiology

## 2017-03-24 DIAGNOSIS — Z87898 Personal history of other specified conditions: Secondary | ICD-10-CM | POA: Diagnosis present

## 2017-03-24 DIAGNOSIS — H93299 Other abnormal auditory perceptions, unspecified ear: Secondary | ICD-10-CM | POA: Diagnosis present

## 2017-03-24 DIAGNOSIS — R2689 Other abnormalities of gait and mobility: Secondary | ICD-10-CM | POA: Diagnosis present

## 2017-03-24 DIAGNOSIS — H9193 Unspecified hearing loss, bilateral: Secondary | ICD-10-CM | POA: Diagnosis present

## 2017-03-24 NOTE — Procedures (Signed)
[facility] [address], [place] [address] Main: [phone] Fax: [phone]  AUDIOLOGICAL EVALUATION  NAME: Andrew Logan  DATE:   03/24/2017 DOB:  1955/06/30  REFERRAL:  Dizziness/Imbalance MRN:  [id number]  REFERENT:  Leilani Able, MD  CASE HISTORY Andrew Logan, a 62 y.o. male, was seen for an audiological evaluation at the request of Leilani Able, MD.  Today, Andrew Logan presented with complaints of hearing difficulties and imbalance.  He has noticed troubles with listening to and understanding his wife.  Andrew Logan believed that his hearing difficulties preceded his dizziness and imbalance.  He mentioned that his mother and father also have hearing difficulties.  He noted slight otalgia in the left ear from using a cotton-tipped applicator recently.  Andrew Logan was referred to Dr. Brion Aliment, otolaryngology for hearing difficulties; however, he decided not to follow up with the physician at the time.  Andrew Logan denied all of the following to the best of his knowledge:  tinnitus, loud noise exposure, aural pressure, otitis externa, ear surgery, head injury, diabetes, cancer, heart conditions, and otoxtoxic medications. Andrew Logan has a history of 2-3 episodes of dizziness and imbalance over the past 10+ years.   The most recent and severe episode of true vertigo, which has been treated by emergency medicine, neurology, and pulmonology, was after waking up in the middle of the night while walking to the bathroom in August 2018.  The room-spinning sensation lasted for approximately 30 minutes with accompanying nausea and vomiting.  His symptoms were exacerbated with movement.  However, Andrew Logan noted a relief from them when sitting and lying down.  Right-beating horizontal nystagmus in addition to normal gait with slight deviation to an unspecified direction was observed by emergency medicine.  Andrew Logan was prescribed Meclizine at the time.  As of now, Andrew Logan experiences slight  imbalance with movement in his day-to-day life.  He denied any recent room-spinning sensations.  A list of differential diagnoses has included acute cerebrovascular accident (CVA) and posterior circulation transient ischemic attack.  CVA has been ruled out with negative MRI and CT scans.  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  No other concerns were mentioned.  No pain was noted.  TEST PROCEDURES Tympanometry, conventional pure tone audiometry, and speech audiometry were administered.  TEST RESULTS Tympanometric values for middle ear volume, pressure, and compliance were within normal limits (Type A) in the right and left ears when compared to normative data.  This is consistent with normal middle ear function, bilaterally.  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  Normal hearing at the remaining frequencies.  Speech audiometry revealed speech reception thresholds using recorded spondee word lists at 15 dB and 5 dB in the right and left ears, respectively.  There is good agreement with the pure tone averages.  Suprathreshold word recognition scores in quiet using recorded NU-6 word lists were 96% at 55 dB HL and 92% at 45 dB HL in the right and left ears, respectively.  The scores are within the expected ranges when compared to the pure tone averages.  Speech-in-noise scores were 76% in the right ear and 88% in the left ear at +5 dB SNR.  The overall test reliability was judged to be good.  SUMMARY AND RECOMMENDATIONS Andrew Logan has primarily normal hearing with a slight sensorineural hearing loss at 3000Hz  - 4000Hz  with excellent word recognition in quiet. In minimal  background noise Andrew Logan continues to have excellent word recognition on the left side, but drops to fair on the left side. Some difficulty hearing in background noise is expected  in most social situations.   Together, we discussed common balance disorders, such as benign positional vertigo and vestibular neuritis, in addition to safety precautions.  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  Please call [phone] to schedule an appointment.   Also discussed with vestibular neuritis due to the isolated attack without any accompanying symptoms related to hearing loss or tinnitus at the time.  It is common for slight imbalance to last for months as the body learns to compensate after an attack.  Again, starting with the [facility] would be recommended for a vestibular evaluation (and vestibular therapy, if necessary).  However, if symptoms persist further evaluation that may require evaluation of ear-specific weakness from caloric responses may be needed.  Andrew Hedgesllen Logan was strongly encouraged to keep all of his appointments.  He will contact us with any questions and/or concerns.  Recommendations: 1) Referral to the [facility] for evaluation and possible treatment is recommended at neurorehabilitation at [address], [place], [place] [address].  Please call [phone] to schedule an appointment.   2)  Since Andrew Logan has a slight reduction in word recognition in background noise on the right side, for optimal hearing in background noise or when a competing message is present: 1) have conversation face to face and maintain eye contact 2) minimize background noise when having a conversation- turn off the TV, move to a quiet area of the area 3) be aware that auditory processing problems become worse with fatigue and stress so that extra vigilance may be needed to remain involved  with conversaton 4) Avoid having important conversation when Andrew Logan back is to the speaker. 5) avoid "multitasking" with electronic devices during conversation (i.eBoyd Logan. Converse without looking at phone, computer, video game, etc).  3)  Since vision is an important part of balance and Andrew Logan reports feeling unsteady when walking to the bathroom at night, keeping a pair of eye glasses, next to his bed, to wear while walking to the bathroom was recommended.  It was also recommended that the path to the bathroom be well lit at night.  4) If symptoms persist follow-up with ENT and/or caloric testing of vestibular function is recommended.  5)  Monitor hearing in 6-12 months, earlier if there are changes or concerns about hearing because Andrew Hedgesllen Shifrin is noticing early sign of hearing loss with only a slight hearing loss currently at 3000Hz  - 4000Hz  bilaterally.  6) Andrew HedgesAllen Sealey was encouraged to keep the vertigo medication prescribed with him in case of another event.  Hoyle SauerJacob Jahlani Lorentz, BA Graduate Student Clinician  Lewie Loroneborah Woodward, AuD, CCC-A Doctor of Audiology 03/24/2017

## 2017-03-25 NOTE — Procedures (Signed)
[facility] [address] [place] [address] Main: [phone] Fax: [phone]  AUDIOLOGICAL EVALUATION  NAME: [name]  DATE:   03/25/2017 DOB:  [birth year]  REFERRAL:  Dizziness/Imbalance MRN:  [id number]  REFERENT:  Leilani Able, MD  CASE HISTORY Reese Stockman, a 62 y.o. male, was seen for an audiological evaluation at the request of Leilani Able, MD.  Today, Freida Busman presented with complaints of hearing difficulties and imbalance.  He has noticed troubles with listening to and understanding his wife.  Yovan believed that his hearing difficulties preceded his dizziness and imbalance.  He mentioned that his mother and father also have hearing difficulties.  He noted slight otalgia in the left ear from using a cotton-tipped applicator recently.  Lautaro was referred to Dr. Brion Aliment, otolaryngology for hearing difficulties; however, he decided not to follow up with the physician at the time.  Landan denied all of the following to the best of his knowledge:  tinnitus, loud noise exposure, aural pressure, otitis externa, ear surgery, head injury, diabetes, cancer, heart conditions, and otoxtoxic medications. Donnavin has a history of 2-3 episodes of dizziness and imbalance over the past 10+ years.   The most recent and severe episode of true vertigo, which has been treated by emergency medicine, neurology, and pulmonology, was after waking up in the middle of the night while walking to the bathroom in August 2018.  The room-spinning sensation lasted for approximately 30 minutes with accompanying nausea and vomiting.  His symptoms were exacerbated with movement.  However, Talbot noted a relief from them when sitting and lying down.  Right-beating horizontal nystagmus in addition to normal gait with slight deviation to an unspecified direction was observed by emergency medicine.  Norwood was prescribed Meclizine at the time.  As of now, Stacey experiences slight  imbalance with movement in his day-to-day life.  He denied any recent room-spinning sensations.  A list of differential diagnoses has included acute cerebrovascular accident (CVA) and posterior circulation transient ischemic attack.  CVA has been ruled out with negative MRI and CT scans.  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  No other concerns were mentioned.  No pain was noted.  TEST PROCEDURES Tympanometry, conventional pure tone audiometry, and speech audiometry were administered.  TEST RESULTS Tympanometric values for middle ear volume, pressure, and compliance were within normal limits (Type A) in the right and left ears when compared to normative data.  This is consistent with normal middle ear function, bilaterally.  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  Normal hearing at the remaining frequencies.  Speech audiometry revealed speech reception thresholds using recorded spondee word lists at 15 dB and 5 dB in the right and left ears, respectively.  There is good agreement with the pure tone averages.  Suprathreshold word recognition scores in quiet using recorded NU-6 word lists were 96% at 55 dB HL and 92% at 45 dB HL in the right and left ears, respectively.  The scores are within the expected ranges when compared to the pure tone averages.  Speech-in-noise scores were 76% in the right ear and 88% in the left ear at +5 dB SNR.  The overall test reliability was judged to be good.  SUMMARY AND RECOMMENDATIONS Cassey Hurrell has primarily normal hearing with a slight sensorineural hearing loss at 3000Hz  - 4000Hz  with excellent word recognition in quiet. In minimal  background noise Freida Busmanllen continues to have excellent word recognition on the left side, but drops to fair on the  right side. Some difficulty hearing in background noise is  expected in most social situations.   Together, we discussed common balance disorders, such as benign positional vertigo and vestibular neuritis, in addition to safety precautions.  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  Please call [phone] to schedule an appointment.   Also discussed with vestibular neuritis due to the isolated attack without any accompanying symptoms related to hearing loss or tinnitus at the time.  It is common for slight imbalance to last for months as the body learns to compensate after an attack.  Again, starting with the [facility] would be recommended for a vestibular evaluation (and vestibular therapy, if necessary).  However, if symptoms persist further evaluation that may require evaluation of ear-specific weakness from caloric responses may be needed.  Linda Hedgesllen Zurcher was strongly encouraged to keep all of his appointments.  He will contact us with any questions and/or concerns.  Recommendations: 1) Referral to the [facility] for evaluation and possible treatment is recommended at neurorehabilitation at [address], [place], [place] [address].  Please call [phone] to schedule an appointment.   2)  Since Linda HedgesAllen Ormiston has a slight reduction in word recognition in background noise on the right side, for optimal hearing in background noise or when a competing message is present: 1) have conversation face to face and maintain eye contact 2) minimize background noise when having a conversation- turn off the TV, move to a quiet area of the area 3) be aware that auditory processing problems become worse with fatigue and stress so that extra vigilance may be needed to remain  involved with conversaton 4) Avoid having important conversation when Freida Busmanllen 's back is to the speaker. 5) avoid "multitasking" with electronic devices during conversation (i.eBoyd Kerbs. Converse without looking at phone, computer, video game, etc).  3)  Since vision is an important part of balance and Freida Busmanllen reports feeling unsteady when walking to the bathroom at night, keeping a pair of eye glasses, next to his bed, to wear while walking to the bathroom was recommended.  It was also recommended that the path to the bathroom be well lit at night.  4) If symptoms persist follow-up with ENT and/or caloric testing of vestibular function is recommended.  5)  Monitor hearing in 6-12 months, earlier if there are changes or concerns about hearing because Linda Hedgesllen Rundquist is noticing early sign of hearing loss with only a slight hearing loss currently at 3000Hz  - 4000Hz  bilaterally.  6) Linda HedgesAllen Teodoro was encouraged to keep the vertigo medication prescribed with him in case of another event.  Hoyle SauerJacob Buccini, BA Graduate Student Clinician  Lewie Loroneborah Woodward, AuD, CCC-A Doctor of Audiology 03/25/2017

## 2017-04-01 ENCOUNTER — Encounter: Payer: Self-pay | Admitting: Internal Medicine

## 2017-04-01 ENCOUNTER — Ambulatory Visit: Payer: 59 | Admitting: Internal Medicine

## 2017-04-01 DIAGNOSIS — J45991 Cough variant asthma: Secondary | ICD-10-CM | POA: Diagnosis not present

## 2017-04-01 DIAGNOSIS — R058 Other specified cough: Secondary | ICD-10-CM

## 2017-04-01 DIAGNOSIS — R05 Cough: Secondary | ICD-10-CM

## 2017-04-01 NOTE — Patient Instructions (Addendum)
For drainage / throat tickle try take CHLORPHENIRAMINE  4 mg - take one every 4 hours as needed - available over the counter- may cause drowsiness so start with just a bedtime dose or two and see how you tolerate it before trying in daytime    If not better then add prilosec 20 mg  Take 30-60 min before first meal of the day x 4 week trial     If still not better then return with all medications in hand

## 2017-04-01 NOTE — Progress Notes (Signed)
Subjective:     Patient ID: Andrew Logan, male   DOB: November 09, 1955,   MRN: [id number]    History of Present Illness  28 yobm never smoker late 1990s developed cp while living in [place] > w/u with mediastinoscopy Pos for Sarcoid x 6 months prednisone  Though cp continued off and on during that time and was not related to pred dose used, then  Early 2000's developed breathing problems ? "fluid buiding up" seemed better p prednisone though required at least one thoracentesis ? On L side  then in 2006 living [place] wt loss/ fatigue > admit and  Dx sarcoid rx mtx sev months at most  > better by 2007 and no flare off mtx ? How long took it but  no chronic rx since then so referred to pulmonary clinic 11/26/2016 by Dr   Pecola Leisure  With  Main c/o cough x 2008 but worse since 2015 assoc with sense of chest congestions s excess mucus typical of uacs.   History of Present Illness  11/26/2016 1st [facility] office visit/ Andrew Logan   Chief Complaint  Patient presents with  . Pulmonary Consult    Referred by Dr. Haskel Schroeder. 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 He c/o occ CP and cough in the am's with white to clear sputum.    onset of sensation of chest congestion/ cough x 10 y esp the last 3 y esp in am year round and assoc watery rhinitis Does teadmill  30 min 3 x week @ 3.7 mph, no grade no sob Midline ant Cp is still comes and goes x once a month maybe lasts one minute not brought on by cough or ex and no assoc with meals he's aware of or assoc n or v or diaphoresis Cough congestion worse when using voice a lot or stressed/ ?  better on symbicort though does not take as maint  rec Start pepcid 20 mg at bedtime until return to see if any of your morning congestion is due to silent noctural reflux If am congestion is not better after 2 weeks > add to Pepcid >>>> CHLORPHENIRAMINE  4 mg - take one every 4 hours as needed - available over the counter- may cause  drowsiness so start with just a bedtime dose or two  GERD diet      03/04/2017  f/u ov/Andrew Logan re: cough x 10 y c/w uacs/  Sarcoid hx  Chief Complaint  Patient presents with  . Follow-up    PFT's done today. He states he developed a cold back in Dec 2018 and he has had some cough since then.  His cough is prod with yellow sputum.  He has been using the Symbicort 4-5 x per wk.     using symb just at hs but hfa very poor/ only uses 4-5 hs /week/ never started the h1 as rec and cough is mainly bothering him at hs rec Continue  pepcid 20 mg at bedtime until return to see if any of your morning congestion is due to silent noctural reflux  Add  CHLORPHENIRAMINE  4 mg - take one every 4 hours as needed - available over the counter- may cause drowsiness so start with just a bedtime dose or two to see if helps your am congestions If not better the next to add prilosec 20 mg Take 30-60 min before first meal of the day until return  Ok to take symbicort or dulera  100 2 pffs at bedtime GERD diet   Please schedule a follow up office visit in 4 weeks, sooner if needed  with all medications /inhalers/ solutions in hand so we can verify exactly what you are taking. This includes all medications from all doctors and over the counters    04/01/2017  f/u ov/Andrew Logan re: cough x 10 y/ added h2 hs but did not use h1 as directed or add ppi as rec Chief Complaint  Patient presents with  . Follow-up    He is coughing less. He is using Symbicort 4-5 x per wk on average.    Dyspnea:  MMRC1 = can walk nl pace, flat grade, can't hurry or go uphills or steps s sob   Cough: mostly in am's , 4 good coughs and clear it:  Mucoid only,< 1 tsp Sleep: poorly / insomnia  No obvious day to day or daytime variability or assoc excess/ purulent sputum or mucus plugs or hemoptysis or cp or chest tightness, subjective wheeze or overt sinus or hb symptoms. No unusual exposure hx or h/o childhood pna/ asthma or knowledge of premature  birth.  Sleeping ok flat without nocturnal  or early am exacerbation  of respiratory  c/o's or need for noct saba. Also denies any obvious fluctuation of symptoms with weather or environmental changes or other aggravating or alleviating factors except as outlined above   Current Allergies, Complete Past Medical History, Past Surgical History, Family History, and Social History were reviewed in [employer] record.  ROS  The following are not active complaints unless bolded Hoarseness, sore throat, dysphagia, dental problems, itching, sneezing,  nasal congestion or discharge of excess mucus or purulent secretions, ear ache,   fever, chills, sweats, unintended wt loss or wt gain, classically pleuritic or exertional cp,  orthopnea pnd or leg swelling, presyncope, palpitations, abdominal pain, anorexia, nausea, vomiting, diarrhea  or change in bowel habits or change in bladder habits, change in stools or change in urine, dysuria, hematuria,  rash, arthralgias, visual complaints, headache, numbness, weakness or ataxia or problems with walking or coordination,  change in mood/affect or memory.        Current Meds  Medication Sig  . budesonide-formoterol (SYMBICORT) 80-4.5 MCG/ACT inhaler Inhale 2 puffs into the lungs 2 (two) times daily as needed.  . Cholecalciferol (VITAMIN D PO) Take 1 capsule by mouth daily.  . Cyanocobalamin (B-12 PO) Take 1 tablet by mouth daily.  . famotidine (PEPCID) 20 MG tablet One at bedtime  . fluticasone (FLONASE) 50 MCG/ACT nasal spray Place 2 sprays into both nostrils daily as needed for allergies or rhinitis.  . Multiple Vitamins-Minerals (MENS 50+ ADVANCED PO) Take 1 capsule by mouth daily.  . TURMERIC PO Take 1 capsule by mouth daily.                       Objective:   Physical Exam   amb somber bm nad    04/01/2017       230  03/04/2017       228   11/26/16 230 lb (104.3 kg)  10/10/16 230 lb (104.3 kg)     Vital signs  reviewed - Note on arrival 02 sats  100% on RA           HEENT: nl dentition,  and oropharynx. Nl external ear canals without cough reflex - mild bilateral non-specific turbinate edema     HEENT: nl dentition, and oropharynx. Nl external ear canals  without cough reflex- mild bilateral non-specific turbinate edema     NECK :  without JVD/Nodes/TM/ nl carotid upstrokes bilaterally   LUNGS: no acc muscle use,  Nl contour chest which is clear to A and P bilaterally without cough on insp or exp maneuvers   CV:  RRR  no s3 or murmur or increase in P2, and no edema   ABD:  soft and nontender with nl inspiratory excursion in the supine position. No bruits or organomegaly appreciated, bowel sounds nl  MS:  Nl gait/ ext warm without deformities, calf tenderness, cyanosis or clubbing No obvious joint restrictions   SKIN: warm and dry without lesions    NEURO:  alert, approp, nl sensorium with  no motor or cerebellar deficits apparent.                Assessment:

## 2017-04-03 ENCOUNTER — Encounter: Payer: Self-pay | Admitting: Internal Medicine

## 2017-04-03 NOTE — Assessment & Plan Note (Addendum)
PFT's 11/28/15 FVC  3.25 (66%) with dlco 84% and no obst / nl f/v curves  FENO 03/04/2017  =   Could not do - 03/04/2017  After extensive coaching inhaler device  effectiveness =    50% from a baseline of  0 > try dulera 100 2 bid if helps any of his symptoms > ? Improved > changed to symb 80 2bid by insurance    still not really clear whether this is asthma or not but if it is it is mild and intermittent  Based on the study from NEJM  378; 20 p 1865 (2018) in pts with mild asthma it is reasonable to use low dose symbicort eg 80 2bid "prn" flare in this setting but I emphasized this was only shown with symbicort and takes advantage of the rapid onset of action but is not the same as "rescue therapy" but can be stopped once the acute symptoms have resolved and the need for rescue has been minimized (< 2 x weekly)

## 2017-04-08 ENCOUNTER — Ambulatory Visit: Payer: 59 | Admitting: Neurology

## 2017-04-08 ENCOUNTER — Encounter: Payer: Self-pay | Admitting: Neurology

## 2017-04-08 VITALS — BP 124/70 | HR 64 | Ht 73.0 in | Wt 232.4 lb

## 2017-04-08 DIAGNOSIS — R42 Dizziness and giddiness: Secondary | ICD-10-CM

## 2017-04-08 NOTE — Patient Instructions (Signed)
Great seeing you! Proceed with balance PT recommended by audiology. Follow-up as needed, call for any changes.

## 2017-04-08 NOTE — Progress Notes (Signed)
NEUROLOGY FOLLOW UP OFFICE NOTE  Andrew Logan [id number] 1955/08/15  HISTORY OF PRESENT ILLNESS: I had the pleasure of seeing Andrew Logan in follow-up in the neurology clinic on 04/08/2017.  The patient was last seen 6 months ago after an episode of vertigo last August 2018. Records and images were personally reviewed where available.  MRI brain did not show any acute changes, there was mild chronic microvascular disease. He had an echo done which was normal, EF 55-60%, left atrium normal. He had an audiogram which was normal, he reports that he is awaiting schedule for "balance PT." He denies any further significant vertigo, but still has brief 2-3 seconds of imbalance when he gets up every now and then. No nausea/vomiting, no focal numbness/tingling/weakness, no falls. He has been having some sharp pains on the left temporal region which he feels may be due to sinus issues. No vision changes.   HPI 10/10/2016:  This is a pleasant 62 year old right-handed man with a history of pulmonary sarcoidosis, presenting after ER visit for vertigo. He reports a similar episode of vertigo in 1996 while he was in the doctor's office, he had a briefer episode of spinning that was less intense that the recent episode. On 09/28/16, he woke up to go to the bathroom, and as he tried to get up, the room was going in circles. He had to stop and lay on the floor for 15 minutes, and noticed that if remained still, dizziness got better. He felt an uncomfortable sensation from his abdomen going up the torso, crawled to the commode where he had a small amount of loose stool, then started vomiting. Symptoms lasted 30 minutes, he was not so much spinning when he got to the ER, but felt that his balance was not good. No speech difficulties or confusion. When he got home, he went to bed, and woke up the next morning feeling almost back to normal, able to drive all the way to [place]. He reports that he had an intense week prior,  and a long day the day before. In the ER, exam had shown horizontal nystagmus with movement to the right, no focal abnormalities. Bloodwork was normal, EKG showed sinus rhythm. He was given meclizine, Benadryl, Reglan, and Compazine. He had an MRI brain without contrast with no acute changes. It was noted that there were white matter changes mildly advanced for age.   He denies any prior history of headaches, but over the past year, he has been having occasional brief sharp mild headaches that dissipate quickly, over the frontal or occipital regions, no associated nausea/vomiting, photo/phonophobia. He denies any tinnitus or ear pressure, but sometimes wonders about hearing loss. He denies any diplopia, dysarthria/dysphagia, neck pain, focal numbness/tingling/weakness, bowel/bladder dysfunction. He has some low back pain.   I personally reviewed MRI brain without contrast which did not show any acute infarct. There was note of mild periventricular and white matter FLAIR changes bilaterally  PAST MEDICAL HISTORY: Past Medical History:  Diagnosis Date  . Pneumonia    greater than 3 years  . Pulmonary sarcoidosis [facility])     MEDICATIONS: Current Outpatient Medications on File Prior to Visit  Medication Sig Dispense Refill  . budesonide-formoterol (SYMBICORT) 80-4.5 MCG/ACT inhaler Inhale 2 puffs into the lungs 2 (two) times daily as needed.    . Cholecalciferol (VITAMIN D PO) Take 1 capsule by mouth daily.    . Cyanocobalamin (B-12 PO) Take 1 tablet by mouth daily.    . famotidine (  PEPCID) 20 MG tablet One at bedtime    . fluticasone (FLONASE) 50 MCG/ACT nasal spray Place 2 sprays into both nostrils daily as needed for allergies or rhinitis.    . Multiple Vitamins-Minerals (MENS 50+ ADVANCED PO) Take 1 capsule by mouth daily.    . TURMERIC PO Take 1 capsule by mouth daily.     No current facility-administered medications on file prior to visit.     ALLERGIES: No Known Allergies  FAMILY  HISTORY: Family History  Problem Relation Age of Onset  . Prostate cancer Maternal Grandfather   . Sarcoidosis Maternal Uncle     SOCIAL HISTORY: Social History   Socioeconomic History  . Marital status: Married    Spouse name: Not on file  . Number of children: Not on file  . Years of education: Not on file  . Highest education level: Not on file  Social Needs  . Financial resource strain: Not on file  . Food insecurity - worry: Not on file  . Food insecurity - inability: Not on file  . Transportation needs - medical: Not on file  . Transportation needs - non-medical: Not on file  Occupational History  . Not on file  Tobacco Use  . Smoking status: Never Smoker  . Smokeless tobacco: Never Used  Substance and Sexual Activity  . Alcohol use: No  . Drug use: No  . Sexual activity: Not on file  Other Topics Concern  . Not on file  Social History Narrative   Andrew Logan is a [employer] who has recently relocated to [place], [place].   He lives in a 2 story home with his wife and has 2 adult children who do not live in the home   He has his Master's Degree in [employer].     REVIEW OF SYSTEMS: Constitutional: No fevers, chills, or sweats, no generalized fatigue, change in appetite Eyes: No visual changes, double vision, eye pain Ear, nose and throat: No hearing loss, ear pain, nasal congestion, sore throat Cardiovascular: No chest pain, palpitations Respiratory:  No shortness of breath at rest or with exertion, wheezes GastrointestinaI: No nausea, vomiting, diarrhea, abdominal pain, fecal incontinence Genitourinary:  No dysuria, urinary retention or frequency Musculoskeletal:  No neck pain, back pain Integumentary: No rash, pruritus, skin lesions Neurological: as above Psychiatric: No depression, insomnia, anxiety Endocrine: No palpitations, fatigue, diaphoresis, mood swings, change in appetite, change in weight, increased  thirst Hematologic/Lymphatic:  No anemia, purpura, petechiae. Allergic/Immunologic: no itchy/runny eyes, nasal congestion, recent allergic reactions, rashes  PHYSICAL EXAM: Vitals:   04/08/17 1548  BP: 124/70  Pulse: 64  SpO2: 97%   General: No acute distress Head:  Normocephalic/atraumatic Neck: supple, no paraspinal tenderness, full range of motion Heart:  Regular rate and rhythm Lungs:  Clear to auscultation bilaterally Back: No paraspinal tenderness Skin/Extremities: No rash, no edema Neurological Exam: alert and oriented to person, place, and time. No aphasia or dysarthria. Fund of knowledge is appropriate.  Recent and remote memory are intact.  Attention and concentration are normal.    Able to name objects and repeat phrases. Cranial nerves: Pupils equal, round, reactive to light. Extraocular movements intact with no nystagmus. Visual fields full. Facial sensation intact. No facial asymmetry. Tongue, uvula, palate midline.  Motor: Bulk and tone normal, muscle strength 5/5 throughout with no pronator drift.  Sensation to light touch intact.  No extinction to double simultaneous stimulation.  Deep tendon reflexes 2+ throughout, toes downgoing.  Finger to  nose testing intact.  Gait narrow-based and steady, able to tandem walk adequately.  Romberg negative.  IMPRESSION: This is a pleasant 62 yo RH man with a history of pulmonary sarcoidosis, who had a transient episode of vertigo with vomiting last 09/28/2016. Symptoms had resolved after 30 minutes, but he continued to feel some imbalance until he went to bed. MRI brain in the ER no acute changes. Echo normal. Symptoms suggestive of peripheral vertigo, his neurological exam is normal. He is awaiting an appointment for balance PT arranged by ENT (?vestibular therapy). We discussed that since he is still having the imbalance episodes, would proceed with this. He will follow-up on a prn basis and knows to call for any changes.   Thank you for  allowing me to participate in his care.  Please do not hesitate to call for any questions or concerns.  The duration of this appointment visit was 15 minutes of face-to-face time with the patient.  Greater than 50% of this time was spent in counseling, explanation of diagnosis, planning of further management, and coordination of care.   Patrcia Dolly, M.D.   CC: Dr. Pecola Leisure

## 2017-06-03 ENCOUNTER — Ambulatory Visit: Payer: Self-pay | Admitting: Audiology

## 2018-09-17 ENCOUNTER — Ambulatory Visit
Admission: RE | Admit: 2018-09-17 | Discharge: 2018-09-17 | Disposition: A | Discharge status: Home / Self Care | Payer: 59 | Source: Ambulatory Visit | Attending: Family Medicine | Admitting: Family Medicine

## 2018-09-17 ENCOUNTER — Other Ambulatory Visit: Payer: Self-pay | Admitting: Family Medicine

## 2018-09-17 DIAGNOSIS — M79601 Pain in right arm: Secondary | ICD-10-CM

## 2018-12-22 IMAGING — CT CT HEAD W/O CM
4 series · 17 of 47 positions shown, 19 images · non-contrast
Comparison: None.

CLINICAL DATA: Slight headache with dizziness and generalized
weakness. Fall without head injury.

EXAM:
CT HEAD WITHOUT CONTRAST
TECHNIQUE: Contiguous axial images were obtained from the base of the skull
through the vertex without intravenous contrast.

[Series 3: head wo · axial · 0.43mm/px · z∈[-106,+14]mm · 7 of 34 slices shown, 9 images]
[im 5/34  brain]
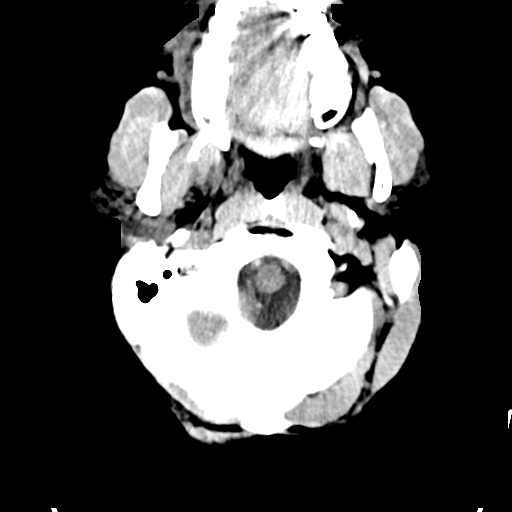
[im 5/34  bone]
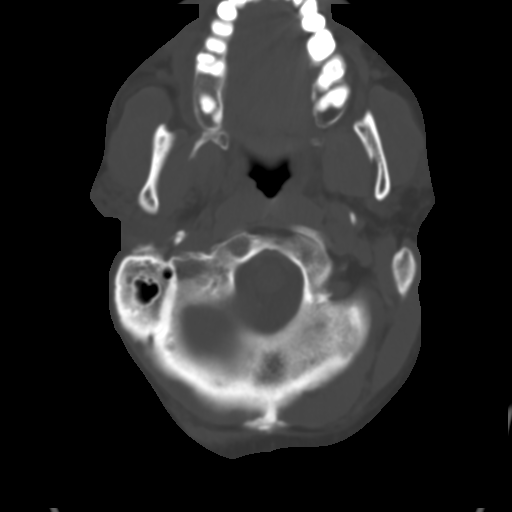
[im 9/34  brain]
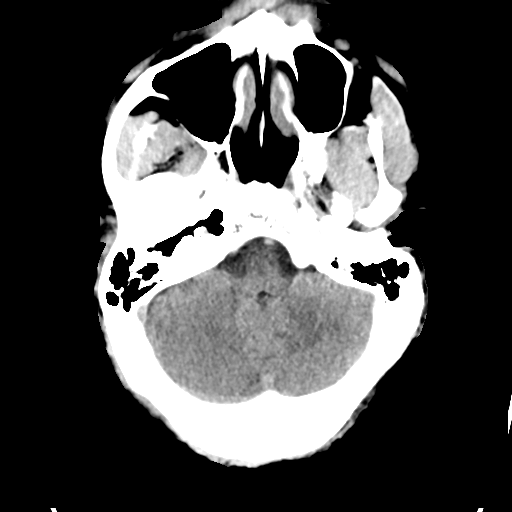
[im 13/34  brain]
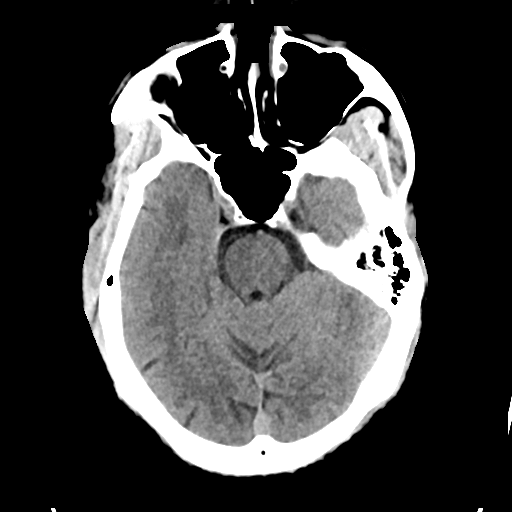
[im 17/34  brain]
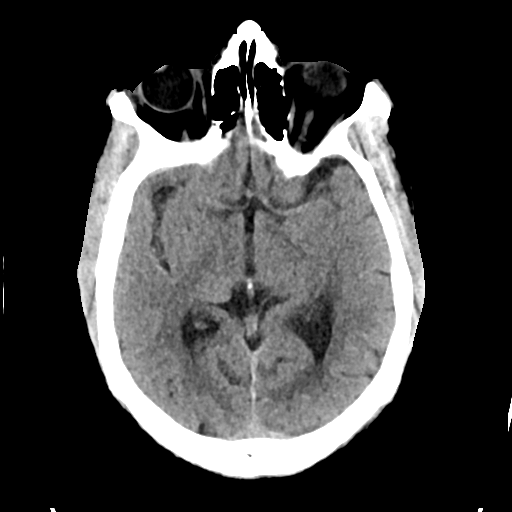
[im 21/34  brain]
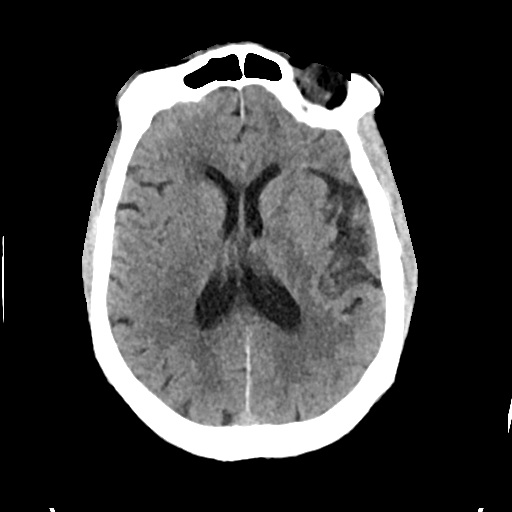
[im 21/34  bone]
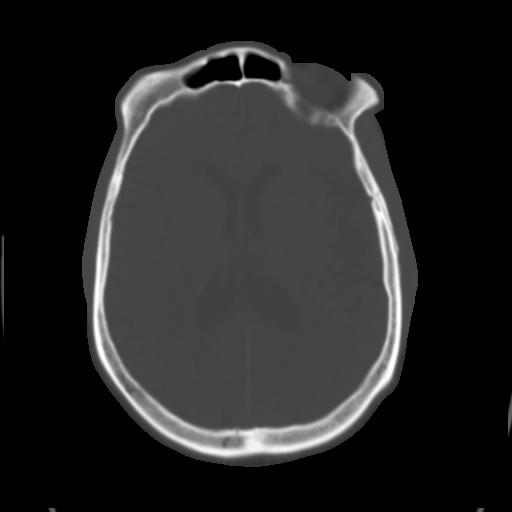
[im 25/34  brain]
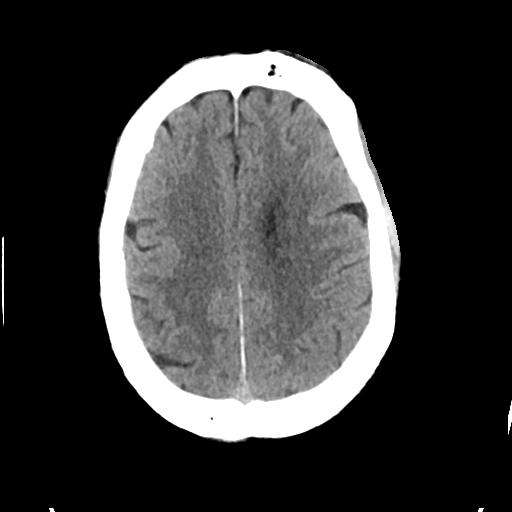
[im 29/34  brain]
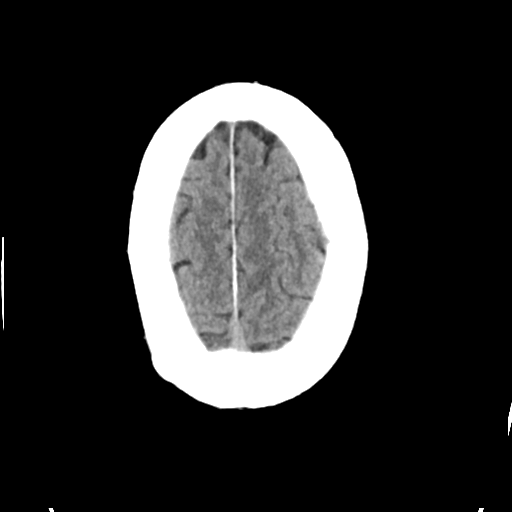

[Series 4: head bone · axial · 0.43mm/px · z∈[-110,-52]mm · 4 of 84 slices shown]
[im 9/84  bone]
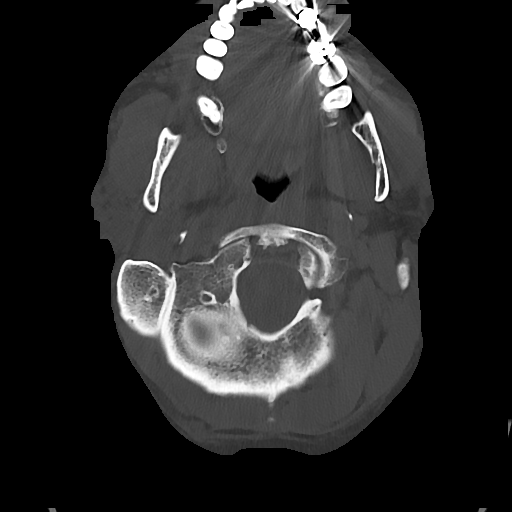
[im 17/84  bone]
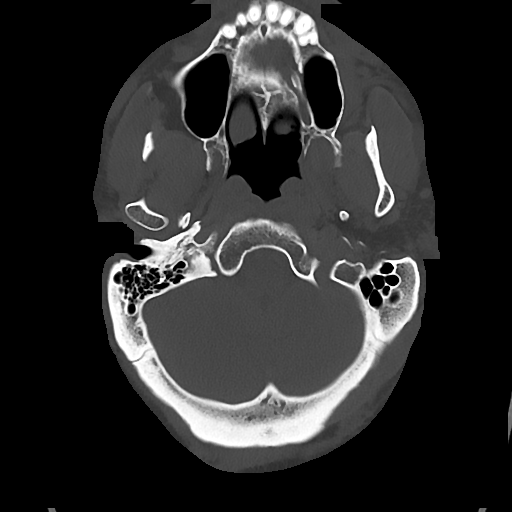
[im 25/84  bone]
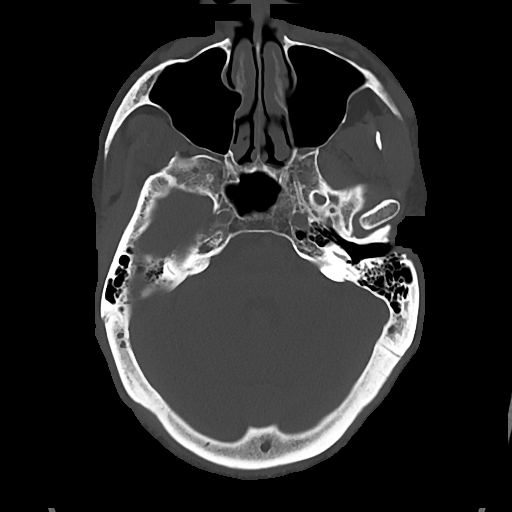
[im 38/84  bone]
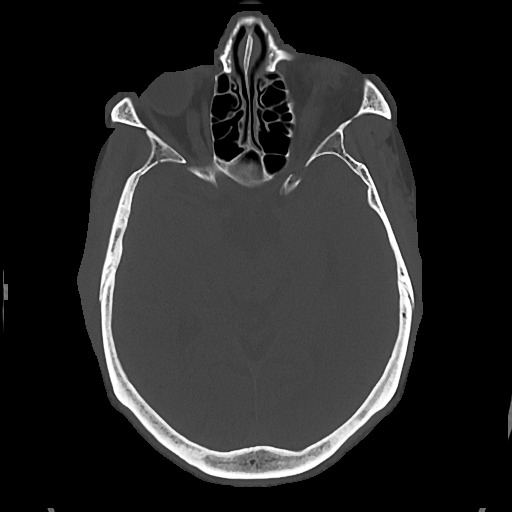

[Series 5: cor soft · coronal · 0.33mm/px · 3 of 67 slices shown]
[im 23/67  brain]
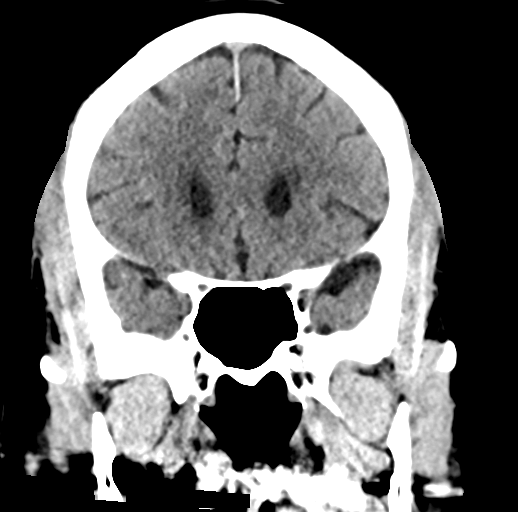
[im 30/67  brain]
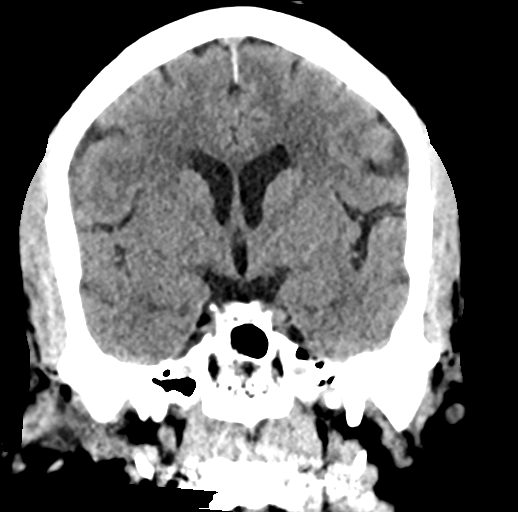
[im 37/67  brain]
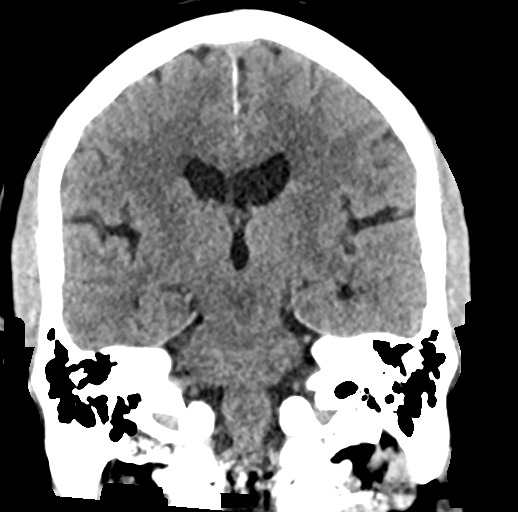

[Series 6: sag soft · sagittal · 0.33mm/px · 3 of 54 slices shown]
[im 18/54  brain]
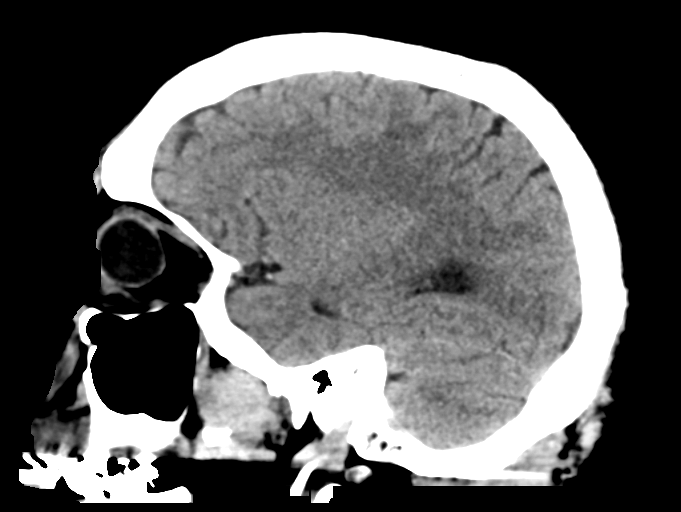
[im 27/54  brain]
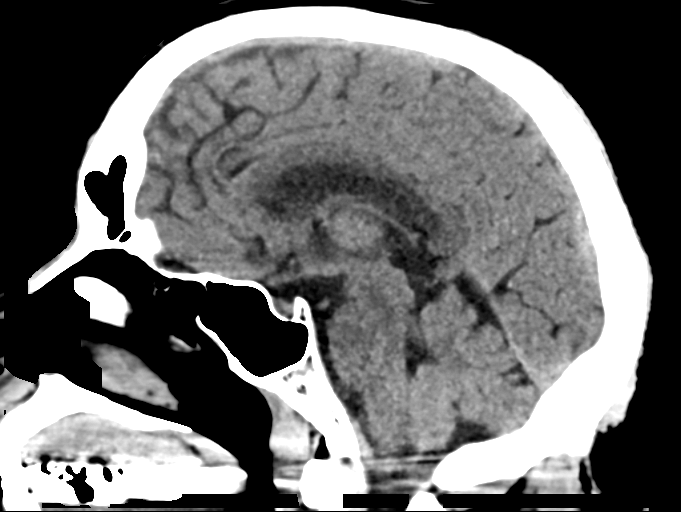
[im 36/54  brain]
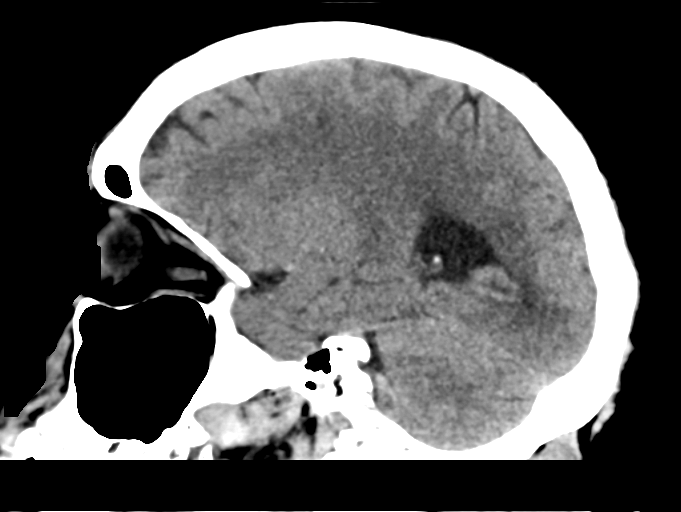

[17 of 47 positions shown; findings below may reference images not displayed]

FINDINGS: Brain: Ventricles, cisterns and other CSF spaces are within normal.
There is no mass, mass effect, shift of midline structures or acute
hemorrhage. There is mild chronic ischemic microvascular disease.

Vascular: No hyperdense vessel or unexpected calcification.

Skull: No evidence of fracture.

Sinuses/Orbits: No acute finding.

Other: None.
IMPRESSION: No acute intracranial findings.

Minimal chronic ischemic microvascular disease.

## 2018-12-22 IMAGING — MR MR HEAD W/O CM
9 of 10 series · 39 of 48 positions shown · non-contrast
Comparison: CT head without contrast from the same day.

CLINICAL DATA: Dizziness beginning this morning.

EXAM:
MRI HEAD WITHOUT CONTRAST
TECHNIQUE: Multiplanar, multiecho pulse sequences of the brain and surrounding
structures were obtained without intravenous contrast.

[Series 3: DWI · axial · 3.0mm · 0.94mm/px · z∈[-125,+22]mm · 9 of 100 slices shown (1 of 2)]
[im 1/100]
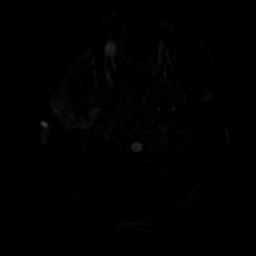
[im 13/100]
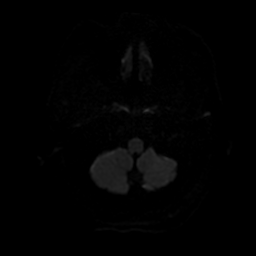
[im 25/100]
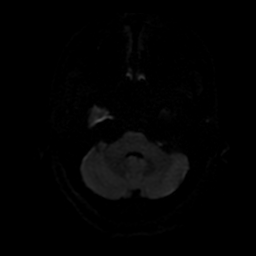
[im 38/100]
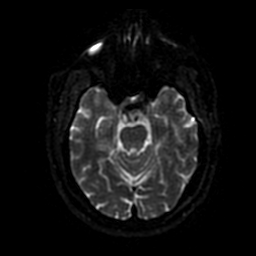
[im 50/100]
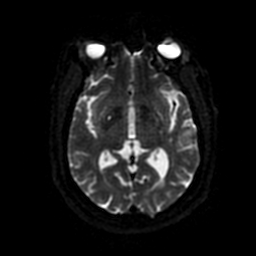
[im 62/100]
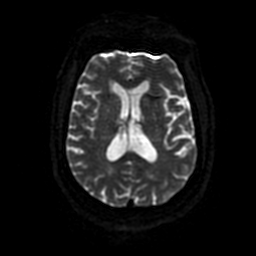
[im 75/100]
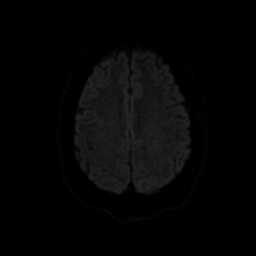
[im 87/100]
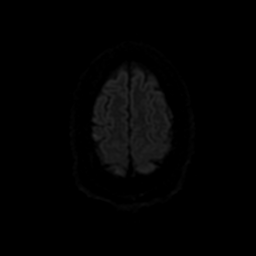
[im 100/100]
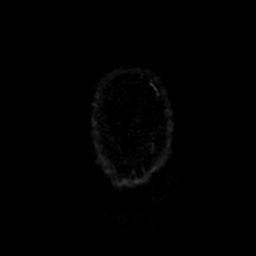

[Series 4: DWI · coronal · 4.0mm · 0.94mm/px · 7 of 72 slices shown (2 of 2)]
[im 1/72]
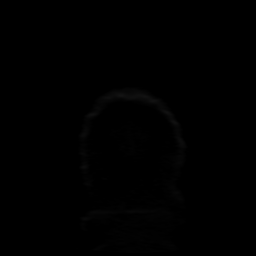
[im 12/72]
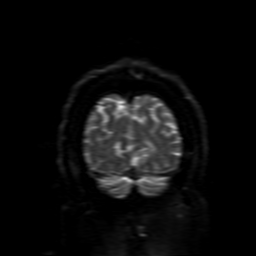
[im 24/72]
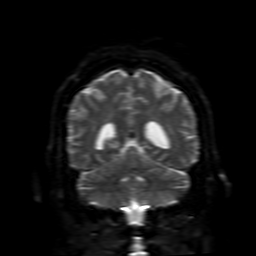
[im 36/72]
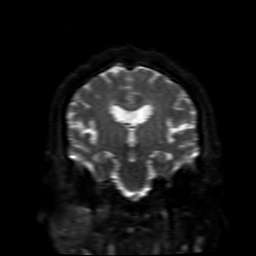
[im 48/72]
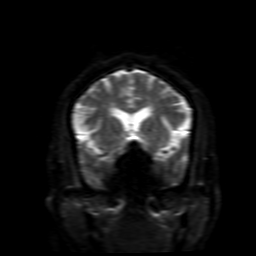
[im 60/72]
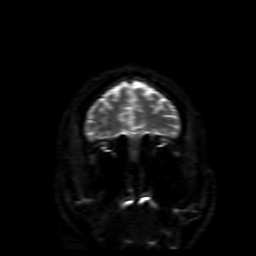
[im 72/72]
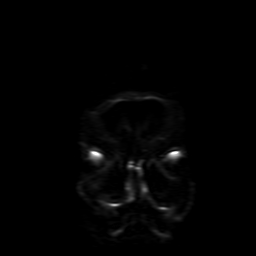

[Series 5: FLAIR · sagittal · 5.0mm · 0.47mm/px · 2 of 23 slices shown (1 of 2)]
[im 1/23]
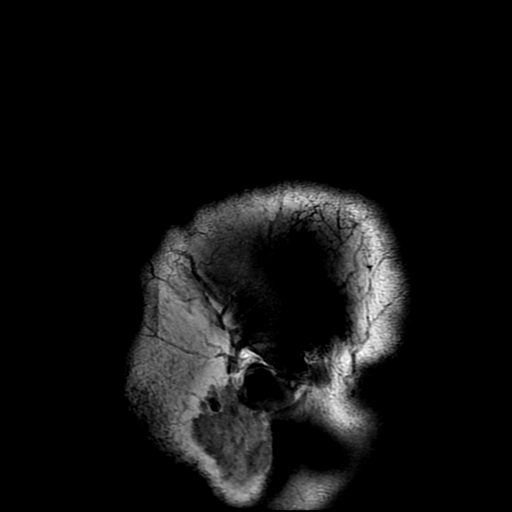
[im 23/23]
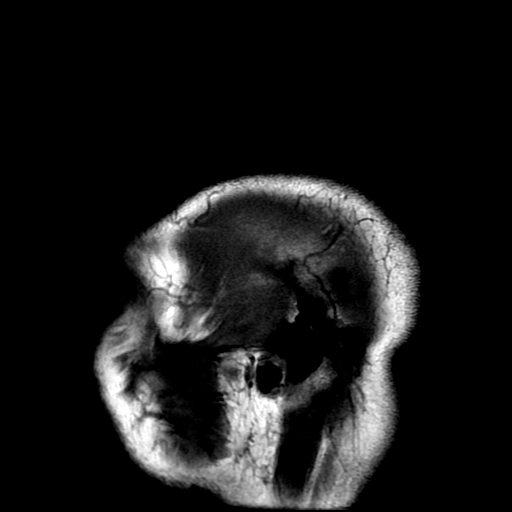

[Series 6: T2 · axial · 5.0mm · 0.47mm/px · z∈[-132,+23]mm · 3 of 27 slices shown (1 of 2)]
[im 1/27]
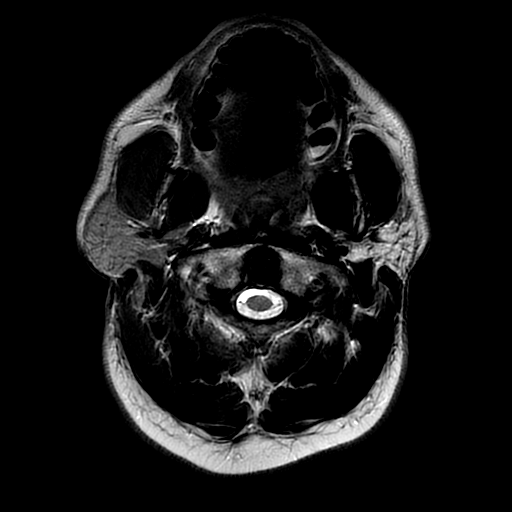
[im 14/27]
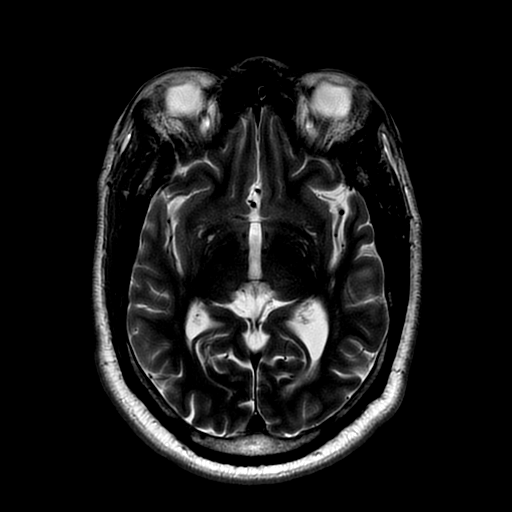
[im 27/27]
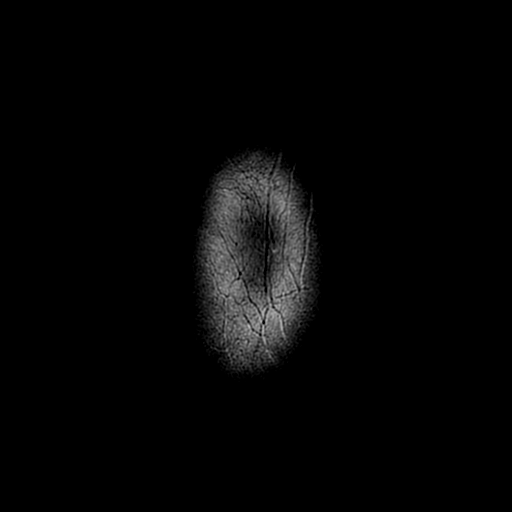

[Series 7: FLAIR · axial · 5.0mm · 0.47mm/px · z∈[-132,+23]mm · 3 of 27 slices shown (2 of 2)]
[im 1/27]
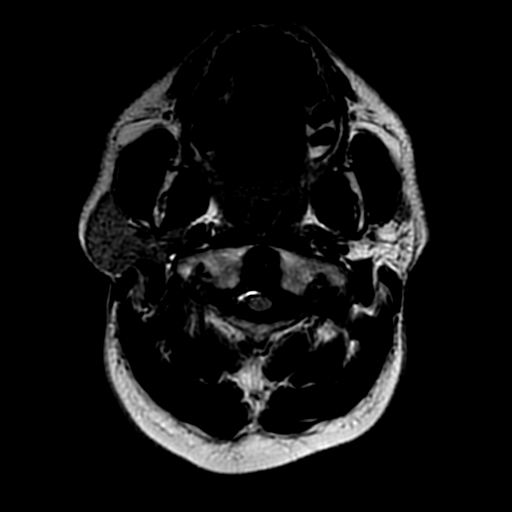
[im 14/27]
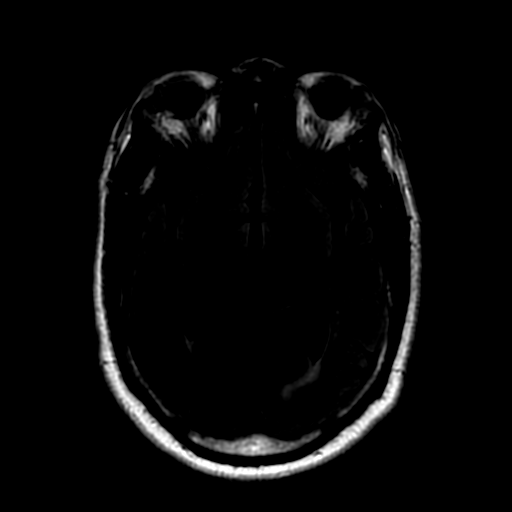
[im 27/27]
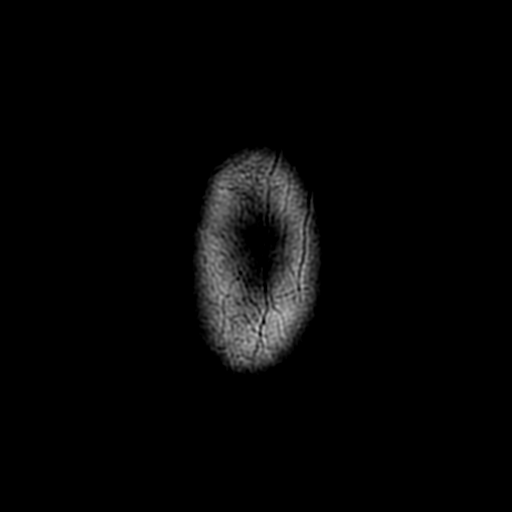

[Series 8: ax 3(person_name) · axial · 3.0mm · 0.94mm/px · z∈[-127,+14]mm · 5 of 48 slices shown]
[im 1/48]
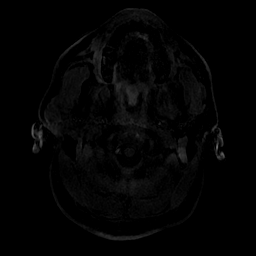
[im 12/48]
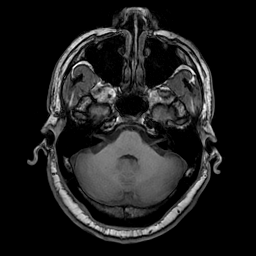
[im 24/48]
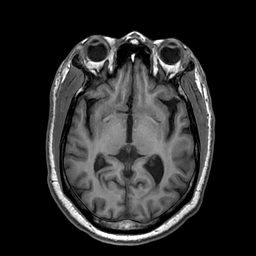
[im 36/48]
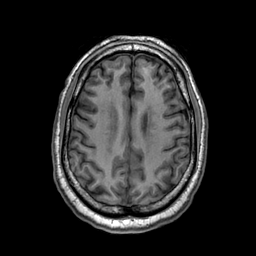
[im 48/48]
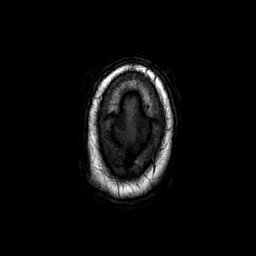

[Series 10: T2 · coronal · 5.0mm · 0.47mm/px · 2 of 25 slices shown (2 of 2)]
[im 1/25]
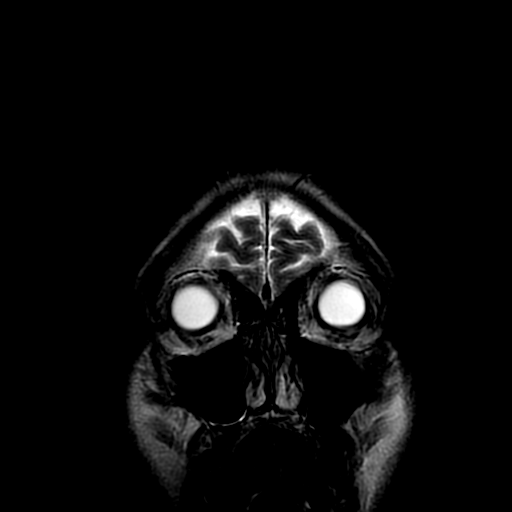
[im 25/25]
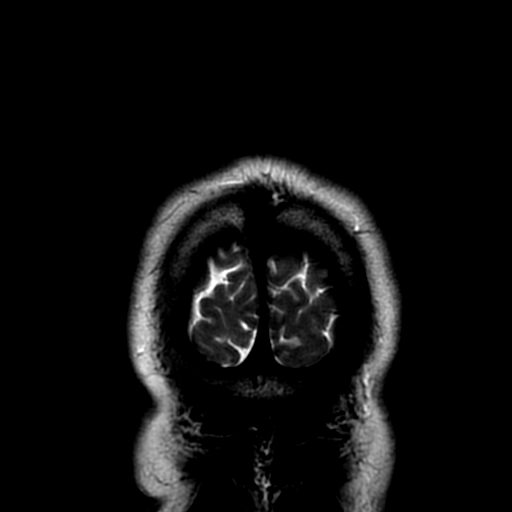

[Series 350: ADC · axial · 3.0mm · 0.94mm/px · z∈[-125,+22]mm · 5 of 50 slices shown (1 of 2)]
[im 1/50]
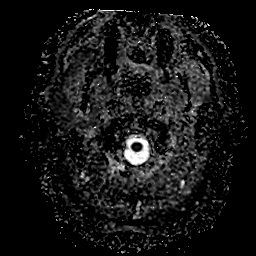
[im 13/50]
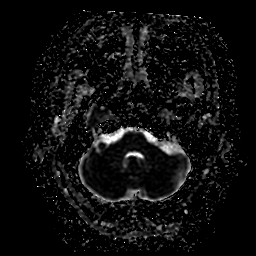
[im 25/50]
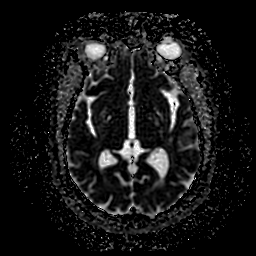
[im 37/50]
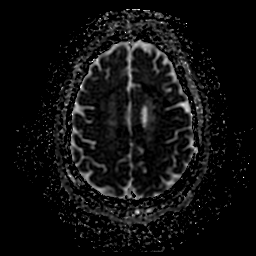
[im 50/50]
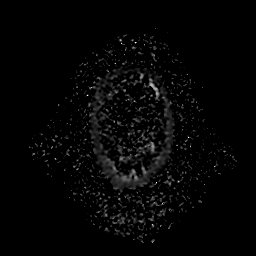

[Series 450: ADC · coronal · 4.0mm · 0.94mm/px · 3 of 36 slices shown (2 of 2)]
[im 1/36]
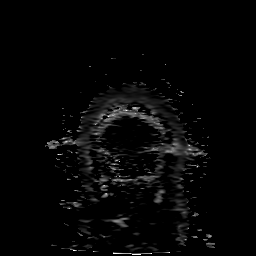
[im 18/36]
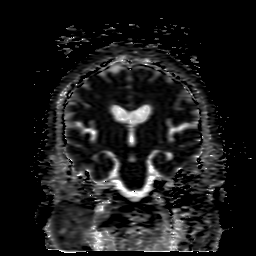
[im 36/36]
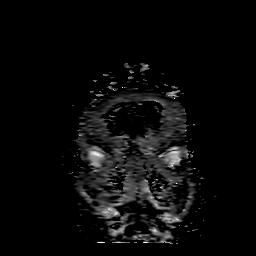

[39 of 48 positions shown; findings below may reference images not displayed]

FINDINGS: Brain: The diffusion-weighted images demonstrate no acute or
subacute infarction. No acute hemorrhage or mass lesion is present.
The ventricles are of normal size. Periventricular and subcortical
white matter changes bilaterally are mildly advanced for age. The
internal auditory canals are within normal limits bilaterally. The
brainstem and cerebellum are normal.

Vascular: Flow is present in the major intracranial arteries.

Skull and upper cervical spine: The skullbase is within normal
limits. Craniocervical junction is normal. Midline sagittal
structures are unremarkable. Marrow signal is within normal limits.

Sinuses/Orbits: The paranasal sinuses and mastoid air cells are
clear. The globes and orbits are within normal limits.
IMPRESSION: 1. No acute intracranial abnormality.
2. Periventricular and subcortical white matter changes bilaterally
are mildly advanced for age. The finding is nonspecific but can be
seen in the setting of chronic microvascular ischemia, a
demyelinating process such as multiple sclerosis, vasculitis,
complicated migraine headaches, or as the sequelae of a prior
infectious or inflammatory process.

## 2019-01-05 ENCOUNTER — Other Ambulatory Visit: Payer: Self-pay | Admitting: Family Medicine

## 2019-01-05 ENCOUNTER — Ambulatory Visit
Admission: RE | Admit: 2019-01-05 | Discharge: 2019-01-05 | Disposition: A | Discharge status: Home / Self Care | Payer: 59 | Source: Ambulatory Visit | Attending: Family Medicine | Admitting: Family Medicine

## 2019-01-05 DIAGNOSIS — M5489 Other dorsalgia: Secondary | ICD-10-CM

## 2019-08-17 ENCOUNTER — Encounter: Payer: Self-pay | Admitting: Cardiology

## 2019-08-17 ENCOUNTER — Ambulatory Visit: Payer: 59 | Admitting: Cardiology

## 2019-08-17 ENCOUNTER — Other Ambulatory Visit: Payer: Self-pay

## 2019-08-17 VITALS — BP 128/75 | HR 51 | Resp 16 | Ht 73.0 in | Wt 236.0 lb

## 2019-08-17 DIAGNOSIS — M7989 Other specified soft tissue disorders: Secondary | ICD-10-CM

## 2019-08-17 DIAGNOSIS — R072 Precordial pain: Secondary | ICD-10-CM

## 2019-08-17 DIAGNOSIS — R9431 Abnormal electrocardiogram [ECG] [EKG]: Secondary | ICD-10-CM

## 2019-08-17 DIAGNOSIS — Z8616 Personal history of COVID-19: Secondary | ICD-10-CM

## 2019-08-17 DIAGNOSIS — D860 Sarcoidosis of lung: Secondary | ICD-10-CM

## 2019-08-17 DIAGNOSIS — I10 Essential (primary) hypertension: Secondary | ICD-10-CM

## 2019-08-17 NOTE — Progress Notes (Signed)
Date:  08/17/2019   ID:  Andrew HedgesAllen Parsell, DOB 09-Oct-1955, MRN [id number]  PCP:  Leilani Ableeese, Betti, MD  Cardiologist:  Tessa LernerSunit Caesar Mannella, DO, [facility] (established care 08/17/2019) Former Cardiology Providers: None  REASON FOR CONSULT: Edema  REQUESTING PHYSICIAN:  Leilani Ableeese, Betti, MD [address] [place],  [place] [id number]  Chief Complaint  Patient presents with  . Edema  . New Patient (Initial Visit)    Referred by Dr. Leilani AbleBetti Reese  . Chest Pain    Burning sensation in the chest    HPI  Andrew Hedgesllen Logan is a 64 y.o. male who presents to the office with a chief complaint of " lower extremity swelling and burning sensation in the chest." He is referred to the office at the request of Leilani Ableeese, Betti, MD. Patient's past medical history and cardiovascular risk factors include: Hx of Covid 19 infection, Hypertension, pulmonary sarcoidosis, obesity due to excess calories.  Sarcoidosis of lung was diagnosed in 1998 and has had flare up in the past (I.e. 2003 and 2006). He use to follow up with pulmonary medicine.   Burning like sensation in the chest, located substernal, 6/10, occurs at rest, not with effort related activities, not associated with food, no improving or worsening factors. No associated symptoms of shortness of breath at rest but if he runs up the stairs at home he will have dyspnea on exertion.   He also has LE swelling located bilaterally. It worsens during the day but improves by the morning. It also improves after taking his BP meds.   Denies prior history of coronary artery disease, myocardial infarction, congestive heart failure, deep venous thrombosis, pulmonary embolism, stroke, transient ischemic attack.  FUNCTIONAL STATUS: Works out on treadmill three days a week for atleast 32 minutes each time.    ALLERGIES: No Known Allergies  MEDICATION LIST PRIOR TO VISIT: Current Meds  Medication Sig  . budesonide-formoterol (SYMBICORT) 80-4.5 MCG/ACT inhaler Inhale 2 puffs into the lungs 2  (two) times daily as needed.  . Cholecalciferol (VITAMIN D PO) Take 1 capsule by mouth daily.  . Cyanocobalamin (B-12 PO) Take 1 tablet by mouth daily.  Marland Kitchen. diltiazem (CARDIZEM) 120 MG tablet Take 120 mg by mouth 2 (two) times daily.  . famotidine (PEPCID) 20 MG tablet One at bedtime  . fluticasone (FLONASE) 50 MCG/ACT nasal spray Place 2 sprays into both nostrils daily as needed for allergies or rhinitis.  Marland Kitchen. LUMIGAN 0.01 % SOLN Place 1 drop into both eyes at bedtime.  . Melatonin 1 MG CHEW Chew 1 mg by mouth daily.  . Multiple Vitamins-Minerals (MENS 50+ ADVANCED PO) Take 1 capsule by mouth daily.  Marland Kitchen. omeprazole (PRILOSEC) 20 MG capsule Take 20 mg by mouth daily.  . TURMERIC PO Take 1 capsule by mouth daily.  Marland Kitchen. zolpidem (AMBIEN) 10 MG tablet Take 10 mg by mouth at bedtime as needed.  . [DISCONTINUED] amLODipine (NORVASC) 10 MG tablet Take 10 mg by mouth daily.     PAST MEDICAL HISTORY: Past Medical History:  Diagnosis Date  . Hypertension   . Pneumonia    greater than 3 years  . Pulmonary sarcoidosis (HCC)     PAST SURGICAL HISTORY: Past Surgical History:  Procedure Laterality Date  . BACK SURGERY    . EYE SURGERY      FAMILY HISTORY: The patient family history includes AAA (abdominal aortic aneurysm) in his father; Heart attack in his sister; Prostate cancer in his maternal grandfather; Sarcoidosis in his maternal uncle; Thyroid disease in his  mother.  SOCIAL HISTORY:  The patient  reports that he has never smoked. He has never used smokeless tobacco. He reports that he does not drink alcohol and does not use drugs.  REVIEW OF SYSTEMS: Review of Systems  Constitutional: Negative for chills and fever.  HENT: Negative for hoarse voice and nosebleeds.   Eyes: Negative for discharge, double vision and pain.  Cardiovascular: Positive for chest pain (burning.), dyspnea on exertion and leg swelling. Negative for claudication, irregular heartbeat, near-syncope, orthopnea,  palpitations, paroxysmal nocturnal dyspnea and syncope.  Respiratory: Negative for hemoptysis and shortness of breath.   Musculoskeletal: Negative for muscle cramps and myalgias.  Gastrointestinal: Negative for abdominal pain, constipation, diarrhea, hematemesis, hematochezia, melena, nausea and vomiting.  Neurological: Negative for dizziness and light-headedness.    PHYSICAL EXAM: Vitals with BMI 08/17/2019 04/08/2017 04/01/2017  Height 6\' 1"  6\' 1"  6\' 0"   Weight 236 lbs 232 lbs 6 oz 230 lbs  BMI 31.14 30.66 31.19  Systolic 128 124  Diastolic 75 70 80  Pulse 51 64 56   CONSTITUTIONAL: Well-developed and well-nourished. No acute distress.  SKIN: Skin is warm and dry. No rash noted. No cyanosis. No pallor. No jaundice HEAD: Normocephalic and atraumatic.  EYES: No scleral icterus MOUTH/THROAT: Moist oral membranes.  NECK: No JVD present. No thyromegaly noted. No carotid bruits  LYMPHATIC: No visible cervical adenopathy.  CHEST Normal respiratory effort. No intercostal retractions  LUNGS: Clear to auscultation bilaterally.  No stridor. No wheezes. No rales.  CARDIOVASCULAR: Regular rate and rhythm, positive S1-S2, no murmurs rubs or gallops appreciated. ABDOMINAL: Soft, nontender, nondistended, positive bowel sounds in all 4 quadrants.  No apparent ascites.  EXTREMITIES: No peripheral edema  HEMATOLOGIC: No significant bruising NEUROLOGIC: Oriented to person, place, and time. Nonfocal. Normal muscle tone.  PSYCHIATRIC: Normal mood and affect. Normal behavior. Cooperative  CARDIAC DATABASE: EKG: 08/17/2019: Sinus  Bradycardia, 52bpm, left axis, poor R wave progression, nonspecific T-abnormality.   Echocardiogram: 10/15/2016: LVEF 55-60%, no regional wall motion abnormalities, normal diastolic function ascending aortic diameter 37 mm, trivial MR.    Stress Testing: None  Heart Catheterization: None  LABORATORY DATA: CBC Latest Ref Rng & Units 03/04/2017 09/28/2016  WBC 4.0 -  10.5 K/uL 6.1 6.1  Hemoglobin 13.0 - 17.0 g/dL 10/17/2016 03/06/2017  Hematocrit 39 - 52 % 42.1 43.6  Platelets 150 - 400 K/uL 222.0 188    CMP Latest Ref Rng & Units 09/28/2016  Glucose 65 - 99 mg/dL 72.6)  BUN 6 - 20 mg/dL 12  Creatinine 20.3 - 11/28/2016 mg/dL 559(R)  Sodium 4.16 - 3.84 mmol/L 137  Potassium 3.5 - 5.1 mmol/L 4.7  Chloride 101 - 111 mmol/L 105  CO2 22 - 32 mmol/L 25  Calcium 8.9 - 10.3 mg/dL 9.5    Lipid Panel  No results found for: CHOL, TRIG, HDL, CHOLHDL, VLDL, LDLCALC, LDLDIRECT, LABVLDL  No components found for: NTPROBNP No results for input(s): PROBNP in the last 8760 hours. No results for input(s): TSH in the last 8760 hours.  BMP No results for input(s): NA, K, CL, CO2, GLUCOSE, BUN, CREATININE, CALCIUM, GFRNONAA, GFRAA in the last 8760 hours.  HEMOGLOBIN A1C No results found for: HGBA1C, MPG  IMPRESSION:    ICD-10-CM   1. Precordial chest pain  R07.2 PCV MYOCARDIAL PERFUSION WITH LEXISCAN  2. Leg swelling  M79.89   3. History of COVID-19  Z86.16   4. Nonspecific abnormal electrocardiogram (ECG) (EKG)  R94.31 PCV MYOCARDIAL PERFUSION WITH LEXISCAN  5. Benign hypertension  I10 EKG 12-Lead    PCV ECHOCARDIOGRAM COMPLETE    PCV MYOCARDIAL PERFUSION WITH LEXISCAN  6. Pulmonary sarcoidosis (HCC)  D86.0      RECOMMENDATIONS: Andrew Logan is a 64 y.o. male whose past medical history and cardiac risk factors include: Hx of Covid 19 infection, Hypertension, pulmonary sarcoidosis, obesity due to excess calories.  Precordial chest pain:  Patient symptoms of chest pain are very atypical in nature.  However he has multiple cardiovascular risk factors and would benefit from an ischemic evaluation.  EKG interpretation noted above.  Echocardiogram will be ordered to evaluate for structural heart disease and left ventricular systolic function.  Nuclear stress test recommended to evaluate for reversible ischemia.  Lower extremity swelling:  Patient does not have any  significant lower extremity swelling on physical examination.  Patient was on amlodipine in the past which may have contributed to his lower extremity swelling that he was experiencing.  Based on his symptoms it appears the patient may have a component of chronic venous insufficiency.  For now I have encouraged him that this is most likely a benign process.  Would like to first complete ischemic evaluation if the symptoms persist may consider lower extremity duplex which would evaluate for both DVT and chronic venous insufficiency.  Benign essential hypertension: Currently managed by primary team.  Office blood pressure is very well managed.  Continue medical therapy.  Pulmonary sarcoidosis: Patient is asked to follow-up with the pulmonologist as per his PCPs recommendation.  Plan of care discussed with the patient who finds it agreeable.  His questions and concerns were addressed to his satisfaction.   FINAL MEDICATION LIST END OF ENCOUNTER: No orders of the defined types were placed in this encounter.   Medications Discontinued During This Encounter  Medication Reason  . amLODipine (NORVASC) 10 MG tablet Patient Preference     Current Outpatient Medications:  .  budesonide-formoterol (SYMBICORT) 80-4.5 MCG/ACT inhaler, Inhale 2 puffs into the lungs 2 (two) times daily as needed., Disp: , Rfl:  .  Cholecalciferol (VITAMIN D PO), Take 1 capsule by mouth daily., Disp: , Rfl:  .  Cyanocobalamin (B-12 PO), Take 1 tablet by mouth daily., Disp: , Rfl:  .  diltiazem (CARDIZEM) 120 MG tablet, Take 120 mg by mouth 2 (two) times daily., Disp: , Rfl:  .  famotidine (PEPCID) 20 MG tablet, One at bedtime, Disp: , Rfl:  .  fluticasone (FLONASE) 50 MCG/ACT nasal spray, Place 2 sprays into both nostrils daily as needed for allergies or rhinitis., Disp: , Rfl:  .  LUMIGAN 0.01 % SOLN, Place 1 drop into both eyes at bedtime., Disp: , Rfl:  .  Melatonin 1 MG CHEW, Chew 1 mg by mouth daily., Disp: ,  Rfl:  .  Multiple Vitamins-Minerals (MENS 50+ ADVANCED PO), Take 1 capsule by mouth daily., Disp: , Rfl:  .  omeprazole (PRILOSEC) 20 MG capsule, Take 20 mg by mouth daily., Disp: , Rfl:  .  TURMERIC PO, Take 1 capsule by mouth daily., Disp: , Rfl:  .  zolpidem (AMBIEN) 10 MG tablet, Take 10 mg by mouth at bedtime as needed., Disp: , Rfl:   Orders Placed This Encounter  Procedures  . PCV MYOCARDIAL PERFUSION WITH LEXISCAN  . EKG 12-Lead  . PCV ECHOCARDIOGRAM COMPLETE   There are no Patient Instructions on file for this visit.   --Continue cardiac medications as reconciled in final medication list. --Return in about 6 weeks (around 09/28/2019) for review test results., re-evaluation of symptoms.Marland Kitchen  Or sooner if needed. --Continue follow-up with your primary care physician regarding the management of your other chronic comorbid conditions.  Patient's questions and concerns were addressed to his satisfaction. He voices understanding of the instructions provided during this encounter.   This note was created using a voice recognition software as a result there may be grammatical errors inadvertently enclosed that do not reflect the nature of this encounter. Every attempt is made to correct such errors.  Tessa Lerner, [place], [facility]  Pager: [phone] Office: [phone]

## 2019-08-30 ENCOUNTER — Other Ambulatory Visit: Payer: Self-pay

## 2019-08-30 ENCOUNTER — Ambulatory Visit: Payer: 59

## 2019-08-30 DIAGNOSIS — I10 Essential (primary) hypertension: Secondary | ICD-10-CM

## 2019-09-06 ENCOUNTER — Ambulatory Visit: Payer: 59

## 2019-09-06 ENCOUNTER — Other Ambulatory Visit: Payer: Self-pay

## 2019-09-06 DIAGNOSIS — R072 Precordial pain: Secondary | ICD-10-CM

## 2019-09-06 DIAGNOSIS — R9431 Abnormal electrocardiogram [ECG] [EKG]: Secondary | ICD-10-CM

## 2019-09-06 DIAGNOSIS — I10 Essential (primary) hypertension: Secondary | ICD-10-CM

## 2019-09-28 ENCOUNTER — Ambulatory Visit: Payer: 59 | Admitting: Cardiology

## 2019-09-28 ENCOUNTER — Encounter: Payer: Self-pay | Admitting: Cardiology

## 2019-09-28 ENCOUNTER — Other Ambulatory Visit: Payer: Self-pay

## 2019-09-28 VITALS — BP 127/77 | HR 60 | Resp 16 | Ht 73.0 in | Wt 231.0 lb

## 2019-09-28 DIAGNOSIS — Z8616 Personal history of COVID-19: Secondary | ICD-10-CM

## 2019-09-28 DIAGNOSIS — Z712 Person consulting for explanation of examination or test findings: Secondary | ICD-10-CM

## 2019-09-28 DIAGNOSIS — M7989 Other specified soft tissue disorders: Secondary | ICD-10-CM

## 2019-09-28 DIAGNOSIS — D860 Sarcoidosis of lung: Secondary | ICD-10-CM

## 2019-09-28 DIAGNOSIS — I5189 Other ill-defined heart diseases: Secondary | ICD-10-CM

## 2019-09-28 DIAGNOSIS — I10 Essential (primary) hypertension: Secondary | ICD-10-CM

## 2019-09-28 MED ORDER — FUROSEMIDE 20 MG PO TABS: 20.0000 mg | ORAL_TABLET | Freq: Every morning | ORAL | 0 refills | Status: DC

## 2019-09-28 NOTE — Progress Notes (Signed)
Date:  10/02/2019   ID:  Andrew Logan, DOB [birth year], MRN [id number]  PCP:  Leilani Able, MD  Cardiologist:  Tessa Lerner, DO, [facility] (established care 08/17/2019) Former Cardiology Providers: None  Date: 09/28/2019 Last Office Visit: 08/17/2019  Chief Complaint  Patient presents with  . Results  . Follow-up    6 weeks    HPI  Andrew Logan is a 64 y.o. male who presents to the office with a chief complaint of " re-evaluation of lower extremity swelling, burning sensation in the chest, and review test results." Patient's past medical history and cardiovascular risk factors include: Hx of Covid 19 infection, Hypertension, pulmonary sarcoidosis, diastolic dysfunction, obesity due to excess calories.  Sarcoidosis of lung was diagnosed in 1998 and has had flare up in the past (I.e. 2003 and 2006). He use to follow up with pulmonary medicine last evaluation was approximately 2 years ago.  At last office visit patient had symptoms of atypical chest pain which he described as burning-like sensation in the chest, not brought on by effort related activities or stress, and it did not improve with resting.  Patient underwent an echocardiogram which noted a preserved left ventricular systolic function but has grade 2 diastolic impairment and elevated left atrial pressure.  Nuclear stress test was noted to be overall low risk study.  Since last visit patient states that his chest pain/burning in her chest sensation is resolved.  Since last office visit patient states that his lower extremity swelling has improved as well.  FUNCTIONAL STATUS: Works out on treadmill three days a week for atleast 32 minutes each time.    ALLERGIES: No Known Allergies  MEDICATION LIST PRIOR TO VISIT: Current Meds  Medication Sig  . Cholecalciferol (VITAMIN D PO) Take 1 capsule by mouth daily.  . Cyanocobalamin (B-12 PO) Take 1 tablet by mouth daily.  Marland Kitchen diltiazem (CARDIZEM) 120 MG tablet Take 120 mg by mouth 2 (two)  times daily.  . fluticasone (FLONASE) 50 MCG/ACT nasal spray Place 2 sprays into both nostrils daily as needed for allergies or rhinitis.  Marland Kitchen LUMIGAN 0.01 % SOLN Place 1 drop into both eyes at bedtime.  . Melatonin 1 MG CHEW Chew 1 mg by mouth daily.  . Multiple Vitamins-Minerals (MENS 50+ ADVANCED PO) Take 1 capsule by mouth daily.  . TURMERIC PO Take 1 capsule by mouth daily.  Marland Kitchen zolpidem (AMBIEN) 10 MG tablet Take 10 mg by mouth at bedtime as needed.     PAST MEDICAL HISTORY: Past Medical History:  Diagnosis Date  . Hypertension   . Pneumonia    greater than 3 years  . Pulmonary sarcoidosis (HCC)     PAST SURGICAL HISTORY: Past Surgical History:  Procedure Laterality Date  . BACK SURGERY    . EYE SURGERY      FAMILY HISTORY: The patient family history includes AAA (abdominal aortic aneurysm) in his father; Heart attack in his sister; Prostate cancer in his maternal grandfather; Sarcoidosis in his maternal uncle; Thyroid disease in his mother.  SOCIAL HISTORY:  The patient  reports that he has never smoked. He has never used smokeless tobacco. He reports that he does not drink alcohol and does not use drugs.  REVIEW OF SYSTEMS: Review of Systems  Constitutional: Negative for chills and fever.  HENT: Negative for hoarse voice and nosebleeds.   Eyes: Negative for discharge, double vision and pain.  Cardiovascular: Positive for leg swelling (improving). Negative for chest pain, claudication, dyspnea on exertion, irregular heartbeat, near-syncope,  orthopnea, palpitations, paroxysmal nocturnal dyspnea and syncope.  Respiratory: Negative for hemoptysis and shortness of breath.   Musculoskeletal: Negative for muscle cramps and myalgias.  Gastrointestinal: Negative for abdominal pain, constipation, diarrhea, hematemesis, hematochezia, melena, nausea and vomiting.  Neurological: Negative for dizziness and light-headedness.   PHYSICAL EXAM: Vitals with BMI 09/28/2019 08/17/2019  04/08/2017  Height 6\' 1"  6\' 1"  6\' 1"   Weight 231 lbs 236 lbs 232 lbs 6 oz  BMI 30.48 31.14 30.66  Systolic 127 128  Diastolic 77 75 70  Pulse 60 51 64   CONSTITUTIONAL: Well-developed and well-nourished. No acute distress.  SKIN: Skin is warm and dry. No rash noted. No cyanosis. No pallor. No jaundice HEAD: Normocephalic and atraumatic.  EYES: No scleral icterus MOUTH/THROAT: Moist oral membranes.  NECK: No JVD present. No thyromegaly noted. No carotid bruits  LYMPHATIC: No visible cervical adenopathy.  CHEST Normal respiratory effort. No intercostal retractions  LUNGS: Clear to auscultation bilaterally.  No stridor. No wheezes. No rales.  CARDIOVASCULAR: Regular rate and rhythm, positive S1-S2, no murmurs rubs or gallops appreciated. ABDOMINAL: Soft, nontender, nondistended, positive bowel sounds in all 4 quadrants.  No apparent ascites.  EXTREMITIES: Trace bilateral peripheral edema  HEMATOLOGIC: No significant bruising NEUROLOGIC: Oriented to person, place, and time. Nonfocal. Normal muscle tone.  PSYCHIATRIC: Normal mood and affect. Normal behavior. Cooperative  CARDIAC DATABASE: EKG: 08/17/2019: Sinus  Bradycardia, 52bpm, left axis, poor R wave progression, nonspecific T-abnormality.   Echocardiogram: 08/30/2019: LVEF 60%, moderate LVH, grade 2 diastolic impairment, elevated LAP, mildly dilated left atrium, trace AR, mild MR.  Stress Testing: Lexiscan Sestamibi stress test 09/06/2019: Lexiscan nuclear stress test performed using 1-day protocol. Normal myocardial perfusion. Stress LVEF 65%. Low risk study.  Heart Catheterization: None  LABORATORY DATA: CBC Latest Ref Rng & Units 03/04/2017 09/28/2016  WBC 4.0 - 10.5 K/uL 6.1 6.1  Hemoglobin 13.0 - 17.0 g/dL 09/08/2019 03/06/2017  Hematocrit 39 - 52 % 42.1 43.6  Platelets 150 - 400 K/uL 222.0 188    CMP Latest Ref Rng & Units 09/28/2016  Glucose 65 - 99 mg/dL 26.8)  BUN 6 - 20 mg/dL 12  Creatinine 34.1 - 11/28/2016 mg/dL 962(I)    Sodium 2.97 - 145 mmol/L 137  Potassium 3.5 - 5.1 mmol/L 4.7  Chloride 101 - 111 mmol/L 105  CO2 22 - 32 mmol/L 25  Calcium 8.9 - 10.3 mg/dL 9.5    Lipid Panel  No results found for: CHOL, TRIG, HDL, CHOLHDL, VLDL, LDLCALC, LDLDIRECT, LABVLDL  No components found for: NTPROBNP No results for input(s): PROBNP in the last 8760 hours. No results for input(s): TSH in the last 8760 hours.  BMP No results for input(s): NA, K, CL, CO2, GLUCOSE, BUN, CREATININE, CALCIUM, GFRNONAA, GFRAA in the last 8760 hours.  HEMOGLOBIN A1C No results found for: HGBA1C, MPG  IMPRESSION:    ICD-10-CM   1. Diastolic dysfunction  I51.89 BASIC METABOLIC PANEL WITH GFR    Magnesium    Basic metabolic panel    Magnesium    furosemide (LASIX) 20 MG tablet  2. Leg swelling  M79.89 BASIC METABOLIC PANEL WITH GFR    Magnesium    Basic metabolic panel    Magnesium    VAS 9.89 LOWER EXTREMITY VENOUS (DVT)    CANCELED: PCV LOWER VENOUS 2.11(H (BILATERAL)  3. History of COVID-19  Z86.16   4. Benign hypertension  I10   5. Pulmonary sarcoidosis (HCC)  D86.0   6. Encounter to discuss test results  Z71.2  RECOMMENDATIONS: Shaheem Pichon is a 64 y.o. male whose past medical history and cardiac risk factors include: Hx of Covid 19 infection, Hypertension, pulmonary sarcoidosis, diastolic dysfunction, obesity due to excess calories.  Precordial chest pain:  Patient stated that his chest discomfort/burning-like sensation in the chest has essentially resolved.  Patient had an ischemic evaluation since last office visit which was reviewed with him in great detail at today's encounter.  Echocardiogram shows preserved LVEF and nuclear stress test overall low study with normal myocardial perfusion.    Diastolic dysfunction:  I did explain the findings of grade 2 diastolic impairment with elevated left atrial pressure.  Given these findings and clinically having lower extremity swelling he would benefit from a low-dose  diuretic therapy to help facilitate diuresis.  Check baseline kidney function and electrolytes.  Start Lasix 20 mg p.o. every morning.  Repeat blood work in 1 week to evaluate kidney function and electrolytes.    Lower extremity swelling:  Patient was on amlodipine in the past which may have contributed to his lower extremity swelling that he was experiencing.  Based on his symptoms it appears the patient may have a component of chronic venous insufficiency.  Order lower extremity venous duplex to evaluate for chronic venous insufficiency.  Benign essential hypertension: Currently managed by primary team.  Office blood pressure is very well managed.  Continue medical therapy.  Pulmonary sarcoidosis: Patient is asked to follow-up with the pulmonologist as per his PCPs recommendation.  FINAL MEDICATION LIST END OF ENCOUNTER: Meds ordered this encounter  Medications  . furosemide (LASIX) 20 MG tablet    Sig: Take 1 tablet (20 mg total) by mouth in the morning.    Dispense:  30 tablet    Refill:  0     Current Outpatient Medications:  .  Cholecalciferol (VITAMIN D PO), Take 1 capsule by mouth daily., Disp: , Rfl:  .  Cyanocobalamin (B-12 PO), Take 1 tablet by mouth daily., Disp: , Rfl:  .  diltiazem (CARDIZEM) 120 MG tablet, Take 120 mg by mouth 2 (two) times daily., Disp: , Rfl:  .  fluticasone (FLONASE) 50 MCG/ACT nasal spray, Place 2 sprays into both nostrils daily as needed for allergies or rhinitis., Disp: , Rfl:  .  LUMIGAN 0.01 % SOLN, Place 1 drop into both eyes at bedtime., Disp: , Rfl:  .  Melatonin 1 MG CHEW, Chew 1 mg by mouth daily., Disp: , Rfl:  .  Multiple Vitamins-Minerals (MENS 50+ ADVANCED PO), Take 1 capsule by mouth daily., Disp: , Rfl:  .  TURMERIC PO, Take 1 capsule by mouth daily., Disp: , Rfl:  .  zolpidem (AMBIEN) 10 MG tablet, Take 10 mg by mouth at bedtime as needed., Disp: , Rfl:  .  famotidine (PEPCID) 20 MG tablet, One at bedtime (Patient not taking:  Reported on 09/28/2019), Disp: , Rfl:  .  furosemide (LASIX) 20 MG tablet, Take 1 tablet (20 mg total) by mouth in the morning., Disp: 30 tablet, Rfl: 0 .  omeprazole (PRILOSEC) 20 MG capsule, Take 20 mg by mouth daily. (Patient not taking: Reported on 09/28/2019), Disp: , Rfl:   Orders Placed This Encounter  Procedures  . BASIC METABOLIC PANEL WITH GFR  . Magnesium  . Basic metabolic panel  . Magnesium  . VAS [place] LOWER EXTREMITY VENOUS (DVT)   There are no Patient Instructions on file for this visit.   --Continue cardiac medications as reconciled in final medication list. --Return in about 6 weeks (around 11/09/2019)  for re-evaluation of symptoms. labs prior. . Or sooner if needed. --Continue follow-up with your primary care physician regarding the management of your other chronic comorbid conditions.  Patient's questions and concerns were addressed to his satisfaction. He voices understanding of the instructions provided during this encounter.   This note was created using a voice recognition software as a result there may be grammatical errors inadvertently enclosed that do not reflect the nature of this encounter. Every attempt is made to correct such errors.  Tessa LernerSunit Tykeria Wawrz[place], [place], [facility]  Pager: [phone] Office: [phone]

## 2019-09-29 LAB — MAGNESIUM: Magnesium: 1.9 mg/dL (ref 1.6–2.3)

## 2019-10-04 ENCOUNTER — Other Ambulatory Visit: Payer: Self-pay

## 2019-10-04 ENCOUNTER — Ambulatory Visit (INDEPENDENT_AMBULATORY_CARE_PROVIDER_SITE_OTHER): Payer: 59

## 2019-10-04 DIAGNOSIS — M7989 Other specified soft tissue disorders: Secondary | ICD-10-CM

## 2019-10-05 ENCOUNTER — Telehealth: Payer: Self-pay | Admitting: Student

## 2019-10-05 NOTE — Telephone Encounter (Signed)
Spoke with customer service at [facility], it appears the BMP with GFR order had not been released to them and therefore it was not completed with the Mg on 09/29/19. [facility] still has patient's blood sample and will attempt to run BMP at this time. Will fax report to office. Customer service rep stated that if they are unable to run the BMP from current sample they will inform the office that the test was not able to be performed.   Confirmed that new orders sent on 10/02/19 were received by [facility].

## 2019-10-05 NOTE — Telephone Encounter (Signed)
-----   Message from [place], [place] sent at 10/02/2019 12:08 PM EDT ----- Regarding: Missing labs Patient had blood work on September 28, 2019.  Only magnesium was reported and BMP is missing.  Please follow-up with [facility] ASAP.  ST

## 2019-10-22 ENCOUNTER — Other Ambulatory Visit: Payer: Self-pay | Admitting: Cardiology

## 2019-10-22 DIAGNOSIS — I5189 Other ill-defined heart diseases: Secondary | ICD-10-CM

## 2019-10-27 LAB — BASIC METABOLIC PANEL
BUN/Creatinine Ratio: 10 (ref 10–24)
BUN: 14 mg/dL (ref 8–27)
CO2: 20 mmol/L (ref 20–29)
Calcium: 10 mg/dL (ref 8.6–10.2)
Chloride: 100 mmol/L (ref 96–106)
Creatinine, Ser: 1.45 mg/dL — ABNORMAL HIGH (ref 0.76–1.27)
GFR calc Af Amer: 59 mL/min/{1.73_m2} — ABNORMAL LOW (ref >59)
GFR calc non Af Amer: 51 mL/min/{1.73_m2} — ABNORMAL LOW (ref >59)
Glucose: 87 mg/dL (ref 65–99)
Potassium: 4.7 mmol/L (ref 3.5–5.2)
Sodium: 138 mmol/L (ref 134–144)

## 2019-10-27 LAB — SPECIMEN STATUS REPORT

## 2019-10-28 ENCOUNTER — Other Ambulatory Visit: Payer: Self-pay | Admitting: Cardiology

## 2019-10-28 DIAGNOSIS — M7989 Other specified soft tissue disorders: Secondary | ICD-10-CM

## 2019-11-04 LAB — BASIC METABOLIC PANEL
BUN/Creatinine Ratio: 9 — ABNORMAL LOW (ref 10–24)
BUN: 13 mg/dL (ref 8–27)
CO2: 27 mmol/L (ref 20–29)
Calcium: 9.5 mg/dL (ref 8.6–10.2)
Chloride: 102 mmol/L (ref 96–106)
Creatinine, Ser: 1.51 mg/dL — ABNORMAL HIGH (ref 0.76–1.27)
GFR calc Af Amer: 56 mL/min/{1.73_m2} — ABNORMAL LOW (ref >59)
GFR calc non Af Amer: 48 mL/min/{1.73_m2} — ABNORMAL LOW (ref >59)
Glucose: 80 mg/dL (ref 65–99)
Potassium: 4.8 mmol/L (ref 3.5–5.2)
Sodium: 141 mmol/L (ref 134–144)

## 2019-11-04 LAB — MAGNESIUM: Magnesium: 2.1 mg/dL (ref 1.6–2.3)

## 2019-11-05 ENCOUNTER — Encounter: Payer: Self-pay | Admitting: Cardiology

## 2019-11-05 ENCOUNTER — Ambulatory Visit: Payer: 59 | Admitting: Cardiology

## 2019-11-05 ENCOUNTER — Other Ambulatory Visit: Payer: Self-pay

## 2019-11-05 VITALS — BP 128/78 | HR 54 | Resp 16 | Ht 73.0 in | Wt 230.0 lb

## 2019-11-05 DIAGNOSIS — I10 Essential (primary) hypertension: Secondary | ICD-10-CM

## 2019-11-05 DIAGNOSIS — Z712 Person consulting for explanation of examination or test findings: Secondary | ICD-10-CM

## 2019-11-05 DIAGNOSIS — Z8616 Personal history of COVID-19: Secondary | ICD-10-CM

## 2019-11-05 DIAGNOSIS — D860 Sarcoidosis of lung: Secondary | ICD-10-CM

## 2019-11-05 DIAGNOSIS — I5189 Other ill-defined heart diseases: Secondary | ICD-10-CM

## 2019-11-05 DIAGNOSIS — R072 Precordial pain: Secondary | ICD-10-CM

## 2019-11-05 NOTE — Progress Notes (Signed)
ID:  Andrew Logan, DOB 12/19/1955, MRN [id number]  PCP:  Leilani Able, MD  Cardiologist:  Tessa Lerner, DO, [facility] (established care 08/17/2019) Former Cardiology Providers: None  Date: 11/05/2019 Last Office Visit: 09/28/2019  Chief Complaint  Patient presents with  . Diastolic Function    Re-evaluation  . Hypertension  . Follow-up    6 week    HPI  Andrew Logan is a 64 y.o. male who presents to the office with a chief complaint of " re-evaluation of diastolic dysfunction and review test results." Patient's past medical history and cardiovascular risk factors include: Hx of Covid 19 infection, Hypertension, pulmonary sarcoidosis, diastolic dysfunction, obesity due to excess calories.  Sarcoidosis of lung was diagnosed in 1998 and has had flare up in the past (I.e. 2003 and 2006). He use to follow up with pulmonary medicine last evaluation was approximately 2 years ago.  During prior office visits he has had symptoms of atypical chest pain for which she has undergone ischemic evaluation.  Echocardiogram noted preserved left ventricular systolic function with grade 2 diastolic impairment and elevated left atrial pressure.  And nuclear stress test was overall reported to be low risk.  Given the elevated left atrial pressure, lower extremity swelling, and grade 2 diastolic impairment, he is recommended today start low-dose Lasix and to have repeat blood work performed.  Patient stated that he is taking Lasix sporadically and now stopped with most recent serum creatinine suggestive of chronic kidney disease stage III.  Patient states that he is not taking any other nephrotoxic agents such as NSAIDs either.  His lower extremity swelling has resolved.  FUNCTIONAL STATUS: Works out on treadmill three days a week for atleast 32 minutes each time.    ALLERGIES: No Known Allergies  MEDICATION LIST PRIOR TO VISIT: Current Meds  Medication Sig  . Cholecalciferol (VITAMIN D PO) Take 1 capsule  by mouth daily.  . Cyanocobalamin (B-12 PO) Take 1 tablet by mouth daily.  Marland Kitchen diltiazem (CARDIZEM) 120 MG tablet Take 120 mg by mouth 2 (two) times daily.  . famotidine (PEPCID) 20 MG tablet One at bedtime  . fluticasone (FLONASE) 50 MCG/ACT nasal spray Place 2 sprays into both nostrils daily as needed for allergies or rhinitis.  Marland Kitchen LUMIGAN 0.01 % SOLN Place 1 drop into both eyes at bedtime.  . Melatonin 1 MG CHEW Chew 1 mg by mouth daily.  . Multiple Vitamins-Minerals (MENS 50+ ADVANCED PO) Take 1 capsule by mouth daily.  . TURMERIC PO Take 1 capsule by mouth daily.     PAST MEDICAL HISTORY: Past Medical History:  Diagnosis Date  . Hypertension   . Pneumonia    greater than 3 years  . Pulmonary sarcoidosis (HCC)     PAST SURGICAL HISTORY: Past Surgical History:  Procedure Laterality Date  . BACK SURGERY    . EYE SURGERY      FAMILY HISTORY: The patient family history includes AAA (abdominal aortic aneurysm) in his father; Heart attack in his sister; Prostate cancer in his maternal grandfather; Sarcoidosis in his maternal uncle; Thyroid disease in his mother.  SOCIAL HISTORY:  The patient  reports that he has never smoked. He has never used smokeless tobacco. He reports that he does not drink alcohol and does not use drugs.  REVIEW OF SYSTEMS: Review of Systems  Constitutional: Negative for chills and fever.  HENT: Negative for hoarse voice and nosebleeds.   Eyes: Negative for discharge, double vision and pain.  Cardiovascular: Negative for chest pain,  claudication, dyspnea on exertion, irregular heartbeat, leg swelling, near-syncope, orthopnea, palpitations, paroxysmal nocturnal dyspnea and syncope.  Respiratory: Negative for hemoptysis and shortness of breath.   Musculoskeletal: Negative for muscle cramps and myalgias.  Gastrointestinal: Negative for abdominal pain, constipation, diarrhea, hematemesis, hematochezia, melena, nausea and vomiting.  Neurological: Negative for  dizziness and light-headedness.   PHYSICAL EXAM: Vitals with BMI 11/05/2019 09/28/2019 08/17/2019  Height 6\' 1"  6\' 1"  6\' 1"   Weight 230 lbs 231 lbs 236 lbs  BMI 30.35 30.48 31.14  Systolic 128 127  Diastolic 78 77 75  Pulse 54 60 51   CONSTITUTIONAL: Well-developed and well-nourished. No acute distress.  SKIN: Skin is warm and dry. No rash noted. No cyanosis. No pallor. No jaundice HEAD: Normocephalic and atraumatic.  EYES: No scleral icterus MOUTH/THROAT: Moist oral membranes.  NECK: No JVD present. No thyromegaly noted. No carotid bruits  LYMPHATIC: No visible cervical adenopathy.  CHEST Normal respiratory effort. No intercostal retractions  LUNGS: Clear to auscultation bilaterally.  No stridor. No wheezes. No rales.  CARDIOVASCULAR: Regular rate and rhythm, positive S1-S2, no murmurs rubs or gallops appreciated. ABDOMINAL: Soft, nontender, nondistended, positive bowel sounds in all 4 quadrants.  No apparent ascites.  EXTREMITIES: Trace bilateral peripheral edema  HEMATOLOGIC: No significant bruising NEUROLOGIC: Oriented to person, place, and time. Nonfocal. Normal muscle tone.  PSYCHIATRIC: Normal mood and affect. Normal behavior. Cooperative  CARDIAC DATABASE: EKG: 08/17/2019: Sinus  Bradycardia, 52bpm, left axis, poor R wave progression, nonspecific T-abnormality.   Echocardiogram: 08/30/2019: LVEF 60%, moderate LVH, grade 2 diastolic impairment, elevated LAP, mildly dilated left atrium, trace AR, mild MR.  Stress Testing: Lexiscan Sestamibi stress test 09/06/2019: Lexiscan nuclear stress test performed using 1-day protocol. Normal myocardial perfusion. Stress LVEF 65%. Low risk study.  Heart Catheterization: None  LABORATORY DATA: CBC Latest Ref Rng & Units 03/04/2017 09/28/2016  WBC 4.0 - 10.5 K/uL 6.1 6.1  Hemoglobin 13.0 - 17.0 g/dL 09/08/2019 03/06/2017  Hematocrit 39 - 52 % 42.1 43.6  Platelets 150 - 400 K/uL 222.0 188    CMP Latest Ref Rng & Units 11/03/2019 09/28/2019  09/28/2016  Glucose 65 - 99 mg/dL 80 87 11/05/2019)  BUN 8 - 27 mg/dL 13 14 12   Creatinine 0.76 - 1.27 mg/dL 11/28/2019) 11/28/2016) 500(X)  Sodium 134 - 144 mmol/L 141 138 137  Potassium 3.5 - 5.2 mmol/L 4.8 4.7 4.7  Chloride 96 - 106 mmol/L 102 100 105  CO2 20 - 29 mmol/L 27 20 25   Calcium 8.6 - 10.2 mg/dL 9.5 9.5    Lipid Panel  No results found for: CHOL, TRIG, HDL, CHOLHDL, VLDL, LDLCALC, LDLDIRECT, LABVLDL  No components found for: NTPROBNP No results for input(s): PROBNP in the last 8760 hours. No results for input(s): TSH in the last 8760 hours.  BMP Recent Labs    09/28/19 1504 11/03/19 1523  NA 138 141  K 4.7 4.8  CL 100 102  CO2 20 27  GLUCOSE 87 80  BUN 14 13  CREATININE 1.45* 1.51*  CALCIUM 10.0 9.5  GFRNONAA 51* 48*  GFRAA 59* 56*    HEMOGLOBIN A1C No results found for: HGBA1C, MPG  IMPRESSION:    ICD-10-CM   1. Diastolic dysfunction  I51.89   2. Precordial chest pain  R07.2   3. Encounter to discuss test results  Z71.2   4. Benign hypertension  I10   5. History of COVID-19  Z86.16   6. Pulmonary sarcoidosis (HCC)  D86.0      RECOMMENDATIONS:  Andrew Logan is a 64 y.o. male whose past medical history and cardiac risk factors include: Hx of Covid 19 infection, Hypertension, pulmonary sarcoidosis, diastolic dysfunction, obesity due to excess calories.  Diastolic dysfunction:  Patient is currently euvolemic and lower extremity swelling has resolved.  Patient has stopped taking Lasix for some time now.  Hold Lasix for now.  Most recent blood work reviewed with the patient at today's visit.  Patient understands that his serum creatinine level is mildly elevated and based on GFR consistent with chronic kidney disease stage III.  I have asked him to discuss it with his primary care provider and even possibly see nephrology if clinically indicated as per his discussion with PCP.    Medications reconciled.  Monitor for now.    Precordial chest pain:  Resolved.  No recurrence of chest discomfort since last office visit.  Ischemic evaluation included echocardiogram and stress test.  Results noted above.  Continue to monitor for now.    Benign essential hypertension: Currently managed by primary team.  Office blood pressure is very well managed.  Continue medical therapy.  Pulmonary sarcoidosis: Patient is asked to follow-up with the pulmonologist as per his PCPs recommendation.  FINAL MEDICATION LIST END OF ENCOUNTER: No orders of the defined types were placed in this encounter.    Current Outpatient Medications:  .  Cholecalciferol (VITAMIN D PO), Take 1 capsule by mouth daily., Disp: , Rfl:  .  Cyanocobalamin (B-12 PO), Take 1 tablet by mouth daily., Disp: , Rfl:  .  diltiazem (CARDIZEM) 120 MG tablet, Take 120 mg by mouth 2 (two) times daily., Disp: , Rfl:  .  famotidine (PEPCID) 20 MG tablet, One at bedtime, Disp: , Rfl:  .  fluticasone (FLONASE) 50 MCG/ACT nasal spray, Place 2 sprays into both nostrils daily as needed for allergies or rhinitis., Disp: , Rfl:  .  LUMIGAN 0.01 % SOLN, Place 1 drop into both eyes at bedtime., Disp: , Rfl:  .  Melatonin 1 MG CHEW, Chew 1 mg by mouth daily., Disp: , Rfl:  .  Multiple Vitamins-Minerals (MENS 50+ ADVANCED PO), Take 1 capsule by mouth daily., Disp: , Rfl:  .  TURMERIC PO, Take 1 capsule by mouth daily., Disp: , Rfl:  .  zolpidem (AMBIEN) 10 MG tablet, Take 10 mg by mouth at bedtime as needed. (Patient not taking: Reported on 11/05/2019), Disp: , Rfl:   No orders of the defined types were placed in this encounter.  There are no Patient Instructions on file for this visit.   --Continue cardiac medications as reconciled in final medication list. --Return in about 1 year (around 11/04/2020) for Reevaluation of diastolic dysfunction. . Or sooner if needed. --Continue follow-up with your primary care physician regarding the management of your other chronic comorbid conditions.  Patient's  questions and concerns were addressed to his satisfaction. He voices understanding of the instructions provided during this encounter.   This note was created using a voice recognition software as a result there may be grammatical errors inadvertently enclosed that do not reflect the nature of this encounter. Every attempt is made to correct such errors.  Tessa Lerner, [place], [facility]  Pager: [phone] Office: [phone]

## 2020-07-14 ENCOUNTER — Encounter (INDEPENDENT_AMBULATORY_CARE_PROVIDER_SITE_OTHER): Payer: Self-pay

## 2020-07-19 ENCOUNTER — Ambulatory Visit (INDEPENDENT_AMBULATORY_CARE_PROVIDER_SITE_OTHER): Payer: 59 | Admitting: Ophthalmology

## 2020-07-19 ENCOUNTER — Encounter (INDEPENDENT_AMBULATORY_CARE_PROVIDER_SITE_OTHER): Payer: Self-pay | Admitting: Ophthalmology

## 2020-07-19 ENCOUNTER — Other Ambulatory Visit: Payer: Self-pay

## 2020-07-19 DIAGNOSIS — H43821 Vitreomacular adhesion, right eye: Secondary | ICD-10-CM

## 2020-07-19 DIAGNOSIS — H2512 Age-related nuclear cataract, left eye: Secondary | ICD-10-CM

## 2020-07-19 DIAGNOSIS — H35351 Cystoid macular degeneration, right eye: Secondary | ICD-10-CM | POA: Diagnosis not present

## 2020-07-19 DIAGNOSIS — H35341 Macular cyst, hole, or pseudohole, right eye: Secondary | ICD-10-CM

## 2020-07-19 DIAGNOSIS — H2511 Age-related nuclear cataract, right eye: Secondary | ICD-10-CM

## 2020-07-19 HISTORY — DX: Age-related nuclear cataract, right eye: H25.11

## 2020-07-19 HISTORY — DX: Cystoid macular degeneration, right eye: H35.351

## 2020-07-19 NOTE — Assessment & Plan Note (Signed)
Moderate nuclear sclerotic changes OS

## 2020-07-19 NOTE — Assessment & Plan Note (Signed)
Component of macular hole formation.

## 2020-07-19 NOTE — Assessment & Plan Note (Signed)
The nature of cataract was discussed with the patient as well as the elective nature of surgery. The patient was reassured that surgery at a later date does not put the patient at risk for a worse outcome. It was emphasized that the need for surgery is dictated by the patient's quality of life as influenced by the cataract. Patient was instructed to maintain close follow up with their general eye care doctor.  Cataract(s) account for the patient's complaint. I discussed the risks and benefits of cataract surgery. Options were explained to the patient. The patient understands that new glasses may not improve their vision and desires to have cataract surgery. I have recommended follow up with their general eye care doctor for evaluation and consideration of cataract extraction with new intraocular lens insertion.  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  I will explained this to the patient that the cataract which exist in the front of the eye will hamper my ability to successfully close the macular hole.  Thus I will asked the patient to return to see Dr. Charlton Haws so that the cataract can be removed in the right eye with placement of intraocular lens so as to allow for adequate visualization of the retina to successfully close the macular hole with vision loss.

## 2020-07-19 NOTE — Progress Notes (Signed)
07/19/2020     CHIEF COMPLAINT Patient presents for Retina Evaluation (NP macular hole OD - Ref'd by Dr. Gerald Stabs Groat//Pt c/o dark circle in "middle of eye" OD x 3 months approx. Pt c/o blurry VA OD x 3 months approx, especially when reading. Pt denies ocular pain or flashes OU. Pt reports occasional floaters OU. VA OS stable. Pt sts he has not had cat sx yet OU.)   HISTORY OF PRESENT ILLNESS: Andrew Logan is a 65 y.o. male who presents to the clinic today for:   HPI    Retina Evaluation    Laterality: right eye   Onset: 3 months ago   Duration: 3 months   Context: distance vision, near vision and reading   Treatments tried: no treatments   Response to treatment: no improvement   Comments: NP macular hole OD - Ref'd by Dr. Midge Aver  Pt c/o dark circle in "middle of eye" OD x 3 months approx. Pt c/o blurry VA OD x 3 months approx, especially when reading. Pt denies ocular pain or flashes OU. Pt reports occasional floaters OU. VA OS stable. Pt sts he has not had cat sx yet OU.       Last edited by Hurman Horn, MD on 07/19/2020  9:28 AM. (History)      Referring physician: Lin Landsman, MD [place],  [place] [address]  HISTORICAL INFORMATION:   Selected notes from the MEDICAL RECORD NUMBER       CURRENT MEDICATIONS: Current Outpatient Medications (Ophthalmic Drugs)  Medication Sig  . LUMIGAN 0.01 % SOLN Place 1 drop into both eyes at bedtime.   No current facility-administered medications for this visit. (Ophthalmic Drugs)   Current Outpatient Medications (Other)  Medication Sig  . Cholecalciferol (VITAMIN D PO) Take 1 capsule by mouth daily.  . Cyanocobalamin (B-12 PO) Take 1 tablet by mouth daily.  Marland Kitchen diltiazem (CARDIZEM) 120 MG tablet Take 120 mg by mouth 2 (two) times daily.  . famotidine (PEPCID) 20 MG tablet One at bedtime  . fluticasone (FLONASE) 50 MCG/ACT nasal spray Place 2 sprays into both nostrils daily as needed for allergies or rhinitis.   . Melatonin 1 MG CHEW Chew 1 mg by mouth daily.  . Multiple Vitamins-Minerals (MENS 50+ ADVANCED PO) Take 1 capsule by mouth daily.  . TURMERIC PO Take 1 capsule by mouth daily.  Marland Kitchen zolpidem (AMBIEN) 10 MG tablet Take 10 mg by mouth at bedtime as needed. (Patient not taking: Reported on 11/05/2019)   No current facility-administered medications for this visit. (Other)      REVIEW OF SYSTEMS:    ALLERGIES No Known Allergies  PAST MEDICAL HISTORY Past Medical History:  Diagnosis Date  . Hypertension   . Pneumonia    greater than 3 years  . 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 BACK SURGERY    . EYE SURGERY      FAMILY HISTORY Family History  Problem Relation Age of Onset  . Prostate cancer Maternal Grandfather   . Sarcoidosis Maternal Uncle   . Thyroid disease Mother   . AAA (abdominal aortic aneurysm) Father   . Heart attack Sister     SOCIAL HISTORY Social History   Tobacco Use  . Smoking status: Never Smoker  . Smokeless tobacco: Never Used  Vaping Use  . Vaping Use: Never used  Substance Use Topics  . Alcohol use: No  . Drug use: No  OPHTHALMIC EXAM: Base Eye Exam    Visual Acuity (ETDRS)      Right Left   Dist cc 20/80ecc -3 20/25 +1   Dist ph cc NI    Correction: Glasses       Tonometry (Tonopen, 9:24 AM)      Right Left   Pressure 14 10       Pupils      Pupils Dark Light Shape React APD   Right PERRL 5 4 Round Brisk None   Left PERRL 5 4 Round Brisk None       Visual Fields (Counting fingers)      Left Right    Full Full       Extraocular Movement      Right Left    Full Full       Neuro/Psych    Oriented x3: Yes   Mood/Affect: Normal       Dilation    Both eyes: 1.0% Mydriacyl, 2.5% Phenylephrine @ 9:24 AM        Slit Lamp and Fundus Exam    External Exam      Right Left   External Normal Normal       Slit Lamp Exam      Right Left   Lids/Lashes Normal  Normal   Conjunctiva/Sclera White and quiet White and quiet   Cornea Corneal iron line nearing the visual axis, which is circumferential to a curvilinear horizontally oriented subepithelial white material that is epithelial ingrowth in the previous ALK incision site from decades ago Clear   Anterior Chamber Deep and quiet Deep and quiet   Iris Round and reactive Round and reactive   Lens 2+ Nuclear sclerosis 2+ Nuclear sclerosis   Anterior Vitreous Normal Normal       Fundus Exam      Right Left   Posterior Vitreous Central vitreous floaters Central vitreous floaters   Disc Normal Normal   C/D Ratio 0.2 0.2   Macula Macular hole, positive Watzke sign Normal   Vessels Normal Normal   Periphery Normal Normal          IMAGING AND PROCEDURES  Imaging and Procedures for 07/19/20  OCT, Retina - OU - Both Eyes       Right Eye Central Foveal Thickness: 392. Progression has no prior data. Findings include abnormal foveal contour, vitreous traction, macular hole, cystoid macular edema.   Left Eye Quality was good. Scan locations included subfoveal. Central Foveal Thickness: 257. Progression has no prior data. Findings include normal foveal contour.   Notes Vitreous traction on inner flap of the macular hole, full-thickness with perifoveal CME secondary OD.  OS normal foveal contour                ASSESSMENT/PLAN:  Cataract, nuclear sclerotic, left eye Moderate nuclear sclerotic changes OS  Cataract, nuclear sclerotic, right eye The nature of cataract was discussed with the patient as well as the elective nature of surgery. The patient was reassured that surgery at a later date does not put the patient at risk for a worse outcome. It was emphasized that the need for surgery is dictated by the patient's quality of life as influenced by the cataract. Patient was instructed to maintain close follow up with their general eye care doctor.  Cataract(s) account for the patient's  complaint. I discussed the risks and benefits of cataract surgery. Options were explained to the patient. The patient understands that new glasses may not improve their vision  and desires to have cataract surgery. I have recommended follow up with their general eye care doctor for evaluation and consideration of cataract extraction with new intraocular lens insertion.  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  I will explained this to the patient that the cataract which exist in the front of the eye will hamper my ability to successfully close the macular hole.  Thus I will asked the patient to return to see Dr. Richardson Chiquito so that the cataract can be removed in the right eye with placement of intraocular lens so as to allow for adequate visualization of the retina to successfully close the macular hole with vision loss.  Macular hole of right eye The nature of macular hole was discussed with the patient as well as possibility of second eye involvement at a later date.  The risks of no treatment were reviewed as well, as the possibility of surgical repair and the risks with that modality.  The requirement for postoperative face downward in a reading position was discussed.  The usual period of 3-5 days, positioning while awake, was disclosed.  The patient must not sleep on back.  The patient may sleep on either side after surgery  for approximately  2 weeks.  The alternative surgical repair with vitrectomy and using silicone oil was discussed.   Typical results with oil for this use to repair macular hole are not as good as gas utilization.   With oil use, the need for a second surgery reviewed.   If the patient's operative eye is [place], cataract surgery is very often recommended by referring to cataract surgeon, prior to Vitrectomy. This will allow a clear view of the macular hole  for the repair,  and best visual acuity results ultimately for the patient.  All the  patient's questions were answered.  An informational note was given.  Repair of macular hole reviewed with the patient and family.  Patient understands need for counter extraction with intraocular lens placement to clear the visual axis for microsurgery to be attempted in the safest most effective way as well as to safely remove the anterior hyaloid to decrease long-term risk of peripheral retinal hole or tears developing post surgery.    Vitreomacular traction syndrome of right eye Component of macular hole formation.      ICD-10-CM   1. Macular hole of right eye  H35.341 OCT, Retina - OU - Both Eyes  2. Cataract, nuclear sclerotic, right eye  H25.11   3. Cystoid macular edema of right eye  H35.351 OCT, Retina - OU - Both Eyes  4. Vitreomacular traction syndrome of right eye  H43.821 OCT, Retina - OU - Both Eyes  5. Cataract, nuclear sclerotic, left eye  H25.12     1.  Macular hole right eye with vision loss.  OD, with perifoveal CME beginning.  2.  Will need cataract traction with intraocular lens placement in the right eye to begin with.  Soon thereafter, within 1 to 2 weeks, may proceed with vitrectomy membrane peel gas injection in the right eye.  Typical postoperative course discussed with the patient and family on restrictions  3.  Return to see Dr. Warden Fillers  promptly for cataract traction with intraocular lens placement ASAP  Will schedule vitrectomy membrane [phone] with gas injection OD 1 to 2 weeks post cataract surgery completion the right eye.  I explained to the patient cataract surgery will not improve his vision  at the at the time of completion because of the presence of the macular hole.  Removal of the cataract however does allow for enhanced visualization of the internal limiting membrane which needs to be peeled at the time of repair of macular hole.  The patient is instructed to contact the office promptly here to discuss with surgery scheduler once date  is given for cataract surgery to allow for repair of macular hole 1 to 2 weeks post completion of cataract surgery OD  Ophthalmic Meds Ordered this visit:  No orders of the defined types were placed in this encounter.      Return ,,, Follow-up Dr. Richardson Chiquito for cataract surgery promptly, for ,,,,Schedule vitrectomy membrane peel [phone] post cataract surgery OD.  There are no Patient Instructions on file for this visit.   Explained the diagnoses, plan, and follow up with the patient and they expressed understanding.  Patient expressed understanding of the importance of proper follow up care.   Clent Demark Octavian Godek M.D. Diseases & Surgery of the Retina and Vitreous Retina [facility] Diabetic [place] 07/19/20     Abbreviations: M myopia (nearsighted); A astigmatism; H hyperopia (farsighted); P presbyopia; Mrx spectacle prescription;  CTL contact lenses; OD right eye; OS left eye; OU both eyes  XT exotropia; ET esotropia; PEK punctate epithelial keratitis; PEE punctate epithelial erosions; DES dry eye syndrome; MGD meibomian gland dysfunction; ATs artificial tears; PFAT's preservative free artificial tears; [place] nuclear sclerotic cataract; PSC posterior subcapsular cataract; ERM epi-retinal membrane; PVD posterior vitreous detachment; RD retinal detachment; DM diabetes mellitus; DR diabetic retinopathy; NPDR non-proliferative diabetic retinopathy; PDR proliferative diabetic retinopathy; CSME clinically significant macular edema; DME diabetic macular edema; dbh dot blot hemorrhages; CWS cotton wool spot; POAG primary open angle glaucoma; C/D cup-to-disc ratio; HVF humphrey visual field; GVF goldmann visual field; OCT optical coherence tomography; IOP intraocular pressure; BRVO Branch retinal vein occlusion; CRVO central retinal vein occlusion; CRAO central retinal artery occlusion; BRAO branch retinal artery occlusion; RT retinal tear; SB scleral buckle; PPV pars plana vitrectomy; VH Vitreous hemorrhage;  PRP panretinal laser photocoagulation; IVK intravitreal kenalog; VMT vitreomacular traction; MH Macular hole;  NVD neovascularization of the disc; NVE neovascularization elsewhere; AREDS age related eye disease study; ARMD age related macular degeneration; POAG primary open angle glaucoma; EBMD epithelial/anterior basement membrane dystrophy; ACIOL anterior chamber intraocular lens; IOL intraocular lens; PCIOL posterior chamber intraocular lens; Phaco/IOL phacoemulsification with intraocular lens placement; [place] photorefractive keratectomy; LASIK laser assisted in situ keratomileusis; HTN hypertension; DM diabetes mellitus; COPD chronic obstructive pulmonary disease

## 2020-07-19 NOTE — Assessment & Plan Note (Signed)
The nature of macular hole was discussed with the patient as well as possibility of second eye involvement at a later date.  The risks of no treatment were reviewed as well, as the possibility of surgical repair and the risks with that modality.  The requirement for postoperative face downward in a reading position was discussed.  The usual period of 3-5 days, positioning while awake, was disclosed.  The patient must not sleep on back.  The patient may sleep on either side after surgery  for approximately  2 weeks.  The alternative surgical repair with vitrectomy and using silicone oil was discussed.   Typical results with oil for this use to repair macular hole are not as good as gas utilization.   With oil use, the need for a second surgery reviewed.   If the patient's operative eye is [place], cataract surgery is very often recommended by referring to cataract surgeon, prior to Vitrectomy. This will allow a clear view of the macular hole  for the repair,  and best visual acuity results ultimately for the patient.  All the patient's questions were answered.  An informational note was given.  Repair of macular hole reviewed with the patient and family.  Patient understands need for counter extraction with intraocular lens placement to clear the visual axis for microsurgery to be attempted in the safest most effective way as well as to safely remove the anterior hyaloid to decrease long-term risk of peripheral retinal hole or tears developing post surgery.

## 2020-07-25 ENCOUNTER — Encounter (INDEPENDENT_AMBULATORY_CARE_PROVIDER_SITE_OTHER): Payer: Self-pay

## 2020-07-27 ENCOUNTER — Encounter (INDEPENDENT_AMBULATORY_CARE_PROVIDER_SITE_OTHER): Payer: 59 | Admitting: Ophthalmology

## 2020-08-22 ENCOUNTER — Other Ambulatory Visit: Payer: Self-pay

## 2020-08-22 ENCOUNTER — Ambulatory Visit (INDEPENDENT_AMBULATORY_CARE_PROVIDER_SITE_OTHER): Payer: 59 | Admitting: Ophthalmology

## 2020-08-22 ENCOUNTER — Encounter (INDEPENDENT_AMBULATORY_CARE_PROVIDER_SITE_OTHER): Payer: Self-pay | Admitting: Ophthalmology

## 2020-08-22 DIAGNOSIS — H35351 Cystoid macular degeneration, right eye: Secondary | ICD-10-CM | POA: Diagnosis not present

## 2020-08-22 DIAGNOSIS — H35341 Macular cyst, hole, or pseudohole, right eye: Secondary | ICD-10-CM | POA: Diagnosis not present

## 2020-08-22 DIAGNOSIS — H179 Unspecified corneal scar and opacity: Secondary | ICD-10-CM

## 2020-08-22 MED ORDER — OFLOXACIN 0.3 % OP SOLN: 1.0000 [drp] | OPHTHALMIC | 0 refills | Status: AC

## 2020-08-22 NOTE — Assessment & Plan Note (Addendum)
OD with macular hole full-thickness with perifoveal CME.  I used these the analogy of women's leg hosing to explained the patient how the elasticity of the retina works and how we use gas bubble postoperatively and that he will need to use the reading position while he is awake for the first 3 days possibly 5 days after surgery.  He also understands he will need to sleep on either the left or right side and not sleep on his back for the next 2 weeks  Vitrectomy membrane peel gas injection SF 6 planned

## 2020-08-22 NOTE — Progress Notes (Signed)
08/22/2020     CHIEF COMPLAINT Patient presents for Retina Follow Up (Fu OD post cat sx 08/10/2020 with Dr. Loletha Grayer. [name] states, "My vision seems to be about the same since I had surgery. I think that this hole is impeding me some. I am still using the Pred and the Anti inflammatory drop from my cat surgery."/)   HISTORY OF PRESENT ILLNESS: Andrew Logan is a 65 y.o. male who presents to the clinic today for:   HPI     Retina Follow Up           Diagnosis: Other   Laterality: right eye   Onset: 4 weeks ago   Severity: mild   Duration: 4 weeks   Course: gradually improving   Comments: Fu OD post cat sx 08/10/2020 with Dr. Shirleen Schirmer Pt states, "My vision seems to be about the same since I had surgery. I think that this hole is impeding me some. I am still using the Pred and the Anti inflammatory drop from my cat surgery."        Last edited by Kendra Opitz, COA on 08/22/2020  3:53 PM.      Referring physician: Lin Landsman, MD [place],  [place] [address]  HISTORICAL INFORMATION:   Selected notes from the MEDICAL RECORD NUMBER       CURRENT MEDICATIONS: Current Outpatient Medications (Ophthalmic Drugs)  Medication Sig   LUMIGAN 0.01 % SOLN Place 1 drop into both eyes at bedtime.   No current facility-administered medications for this visit. (Ophthalmic Drugs)   Current Outpatient Medications (Other)  Medication Sig   Cholecalciferol (VITAMIN D PO) Take 1 capsule by mouth daily.   Cyanocobalamin (B-12 PO) Take 1 tablet by mouth daily.   diltiazem (CARDIZEM) 120 MG tablet Take 120 mg by mouth 2 (two) times daily.   famotidine (PEPCID) 20 MG tablet One at bedtime   fluticasone (FLONASE) 50 MCG/ACT nasal spray Place 2 sprays into both nostrils daily as needed for allergies or rhinitis.   Melatonin 1 MG CHEW Chew 1 mg by mouth daily.   Multiple Vitamins-Minerals (MENS 50+ ADVANCED PO) Take 1 capsule by mouth daily.   TURMERIC PO Take 1 capsule by mouth  daily.   zolpidem (AMBIEN) 10 MG tablet Take 10 mg by mouth at bedtime as needed. (Patient not taking: Reported on 11/05/2019)   No current facility-administered medications for this visit. (Other)      REVIEW OF SYSTEMS:    ALLERGIES No Known Allergies  PAST MEDICAL HISTORY Past Medical History:  Diagnosis Date   Cataract, nuclear sclerotic, right eye 07/19/2020   The nature of cataract was discussed with the patient as well as the elective nature of surgery. The patient was reassured that surgery at a later date does not put the patient at risk for a worse outcome. It was emphasized that the need for surgery is dictated by the patient's quality of life as influenced by the cataract. 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[facility])    Past Surgical History:  Procedure Laterality Date   BACK SURGERY     EYE SURGERY      FAMILY HISTORY Family History  Problem Relation Age of Onset   Prostate cancer Maternal Grandfather    Sarcoidosis Maternal Uncle    Thyroid disease Mother  AAA (abdominal aortic aneurysm) Father    Heart attack Sister     SOCIAL HISTORY Social History   Tobacco Use   Smoking status: Never   Smokeless tobacco: Never  Vaping Use   Vaping Use: Never used  Substance Use Topics   Alcohol use: No   Drug use: No         OPHTHALMIC EXAM:  Base Eye Exam     Visual Acuity (ETDRS)       Right Left   Dist cc 20/400 20/25 -2   Dist ph cc 20/200     Correction: Glasses         Tonometry (Tonopen, 3:57 PM)       Right Left   Pressure 11 11         Pupils       Pupils Dark Light Shape React APD   Right PERRL 5 4 Round Brisk None   Left PERRL 5 4 Round Brisk None         Visual Fields (Counting fingers)       Left Right    Full Full         Extraocular Movement       Right Left    Full Full          Neuro/Psych     Oriented x3: Yes   Mood/Affect: Normal         Dilation     Right eye: 1.0% Mydriacyl, 2.5% Phenylephrine @ 3:57 PM           Slit Lamp and Fundus Exam     External Exam       Right Left   External Normal Normal         Slit Lamp Exam       Right Left   Lids/Lashes Normal Normal   Conjunctiva/Sclera White and quiet White and quiet   Cornea Corneal iron line nearing the visual axis, which is circumferential to a curvilinear horizontally oriented subepithelial white material that is epithelial ingrowth in the previous ALK incision site from decades ago Clear   Anterior Chamber Deep and quiet Deep and quiet   Iris Round and reactive Round and reactive   Lens Posterior chamber intraocular lens 2+ Nuclear sclerosis   Anterior Vitreous Normal Normal         Fundus Exam       Right Left   Posterior Vitreous Central vitreous floaters Central vitreous floaters   Disc Normal Normal   C/D Ratio 0.2 0.2   Macula Macular hole, positive Watzke sign, approximately 400-425 m size Normal   Vessels Normal Normal   Periphery Normal Normal            IMAGING AND PROCEDURES  Imaging and Procedures for 08/22/20  OCT, Retina - OU - Both Eyes       Right Eye Central Foveal Thickness: 392. Progression has been stable. Findings include abnormal foveal contour, vitreous traction, macular hole, cystoid macular edema.   Left Eye Quality was good. Scan locations included subfoveal. Central Foveal Thickness: 257. Progression has been stable. Findings include normal foveal contour.   Notes Vitreous traction on inner flap of the macular hole, full-thickness with perifoveal CME secondary OD.  OS normal foveal contour             ASSESSMENT/PLAN:  Macular hole of right eye OD with macular hole full-thickness with perifoveal CME.  I used these the analogy of women's leg hosing to explained  the patient how the elasticity of the retina works and how we  use gas bubble postoperatively and that he will need to use the reading position while he is awake for the first 3 days possibly 5 days after surgery.  He also understands he will need to sleep on either the left or right side and not sleep on his back for the next 2 weeks  Vitrectomy membrane peel gas injection SF 6 planned  Cystoid macular edema of right eye Secondary to macular hole     ICD-10-CM   1. Macular hole of right eye  H35.341 OCT, Retina - OU - Both Eyes    2. Cystoid macular edema of right eye  H35.351       1.  2.  3.  Ophthalmic Meds Ordered this visit:  No orders of the defined types were placed in this encounter.      Return in about 2 years (around 08/23/2022) for POST OP, OD, dilate.  There are no Patient Instructions on file for this visit.   Explained the diagnoses, plan, and follow up with the patient and they expressed understanding.  Patient expressed understanding of the importance of proper follow up care.   Clent Demark Graesyn Schreifels M.D. Diseases & Surgery of the Retina and Vitreous [facility] [place] 08/22/20     Abbreviations: M myopia (nearsighted); A astigmatism; H hyperopia (farsighted); P presbyopia; Mrx spectacle prescription;  CTL contact lenses; OD right eye; OS left eye; OU both eyes  XT exotropia; ET esotropia; PEK punctate epithelial keratitis; PEE punctate epithelial erosions; DES dry eye syndrome; MGD meibomian gland dysfunction; ATs artificial tears; PFAT's preservative free artificial tears; [place] nuclear sclerotic cataract; PSC posterior subcapsular cataract; ERM epi-retinal membrane; PVD posterior vitreous detachment; RD retinal detachment; DM diabetes mellitus; DR diabetic retinopathy; NPDR non-proliferative diabetic retinopathy; PDR proliferative diabetic retinopathy; CSME clinically significant macular edema; DME diabetic macular edema; dbh dot blot hemorrhages; CWS cotton wool spot; POAG primary open angle glaucoma; C/D  cup-to-disc ratio; HVF humphrey visual field; GVF goldmann visual field; OCT optical coherence tomography; IOP intraocular pressure; BRVO Branch retinal vein occlusion; CRVO central retinal vein occlusion; CRAO central retinal artery occlusion; BRAO branch retinal artery occlusion; RT retinal tear; SB scleral buckle; PPV pars plana vitrectomy; VH Vitreous hemorrhage; PRP panretinal laser photocoagulation; IVK intravitreal kenalog; VMT vitreomacular traction; MH Macular hole;  NVD neovascularization of the disc; NVE neovascularization elsewhere; AREDS age related eye disease study; ARMD age related macular degeneration; POAG primary open angle glaucoma; EBMD epithelial/anterior basement membrane dystrophy; ACIOL anterior chamber intraocular lens; IOL intraocular lens; PCIOL posterior chamber intraocular lens; Phaco/IOL phacoemulsification with intraocular lens placement; [place] photorefractive keratectomy; LASIK laser assisted in situ keratomileusis; HTN hypertension; DM diabetes mellitus; COPD chronic obstructive pulmonary disease

## 2020-08-22 NOTE — Assessment & Plan Note (Signed)
Secondary to macular hole

## 2020-08-22 NOTE — Assessment & Plan Note (Addendum)
no change since last visitInferior corneal scar, along the inferior visual axis

## 2020-08-23 ENCOUNTER — Encounter (AMBULATORY_SURGERY_CENTER): Payer: 59 | Admitting: Ophthalmology

## 2020-08-23 DIAGNOSIS — H35341 Macular cyst, hole, or pseudohole, right eye: Secondary | ICD-10-CM | POA: Diagnosis not present

## 2020-08-24 ENCOUNTER — Encounter (INDEPENDENT_AMBULATORY_CARE_PROVIDER_SITE_OTHER): Payer: Self-pay | Admitting: Ophthalmology

## 2020-08-24 ENCOUNTER — Ambulatory Visit (INDEPENDENT_AMBULATORY_CARE_PROVIDER_SITE_OTHER): Payer: 59 | Admitting: Ophthalmology

## 2020-08-24 ENCOUNTER — Other Ambulatory Visit: Payer: Self-pay

## 2020-08-24 DIAGNOSIS — H43821 Vitreomacular adhesion, right eye: Secondary | ICD-10-CM

## 2020-08-24 DIAGNOSIS — H35341 Macular cyst, hole, or pseudohole, right eye: Secondary | ICD-10-CM

## 2020-08-24 NOTE — Patient Instructions (Signed)
Ofloxacin  4 times daily to the operative eye  Prednisolone acetate 1 drop to the operative eye 4 times daily  Patient instructed not to refill the medications and use them for maximum of 3 weeks.  Patient instructed do not rub the eye.  Patient has the option to use the patch at night.  The patient was found to be doing well, postoperatively, and was advised in the use of drops and home care.  Patient was advised not to rub eyes. Positioning was described as well, as precautions regarding intravitreal gas, if applicable. DO NOT TRAVEL TO MOUNTAINS, OR IN AIRPLANE, UNTIL THE BUBBLE INSIDE THE EYE HAS DISAPPEARED.  The use of eye patch at night is optional and was discussed. May use paper tape with patch if patient has dry skin and transpore tape for oily skin.   Use topical medications as ordered. 

## 2020-08-24 NOTE — Assessment & Plan Note (Signed)
Resolved post vitrectomy 

## 2020-08-24 NOTE — Progress Notes (Addendum)
08/24/2020     CHIEF COMPLAINT Patient presents for Post-op Follow-up (Postop day #1 status post vitrectomy membrane peel for macular hole, right eye)   HISTORY OF PRESENT ILLNESS: Andrew Logan is a 65 y.o. male who presents to the clinic today for:   HPI     Post-op Follow-up           Laterality: right eye   Discomfort: itching and tearing   MD Performed: performed the HPI with the patient and updated documentation appropriately   Comments: Postop day #1 status post vitrectomy membrane peel for macular hole, right eye       Last edited by Hurman Horn, MD on 08/24/2020  8:17 AM.      Referring physician: Lin Landsman, MD [place],  [place] [id number]  HISTORICAL INFORMATION:   Selected notes from the [place]: Current Outpatient Medications (Ophthalmic Drugs)  Medication Sig   LUMIGAN 0.01 % SOLN Place 1 drop into both eyes at bedtime.   ofloxacin (OCUFLOX) 0.3 % ophthalmic solution Place 1 drop into the right eye every 4 (four) hours for 10 days.   No current facility-administered medications for this visit. (Ophthalmic Drugs)   Current Outpatient Medications (Other)  Medication Sig   Cholecalciferol (VITAMIN D PO) Take 1 capsule by mouth daily.   Cyanocobalamin (B-12 PO) Take 1 tablet by mouth daily.   diltiazem (CARDIZEM) 120 MG tablet Take 120 mg by mouth 2 (two) times daily.   famotidine (PEPCID) 20 MG tablet One at bedtime   fluticasone (FLONASE) 50 MCG/ACT nasal spray Place 2 sprays into both nostrils daily as needed for allergies or rhinitis.   Melatonin 1 MG CHEW Chew 1 mg by mouth daily.   Multiple Vitamins-Minerals (MENS 50+ ADVANCED PO) Take 1 capsule by mouth daily.   TURMERIC PO Take 1 capsule by mouth daily.   zolpidem (AMBIEN) 10 MG tablet Take 10 mg by mouth at bedtime as needed. (Patient not taking: Reported on 11/05/2019)   No current facility-administered medications for this visit.  (Other)      REVIEW OF SYSTEMS:    ALLERGIES No Known Allergies  PAST MEDICAL HISTORY Past Medical History:  Diagnosis Date   Cataract, nuclear sclerotic, right eye 07/19/2020   The nature of cataract was discussed with the patient as well as the elective nature of surgery. The patient was reassured that surgery at a later date does not put the patient at risk for a worse outcome. It was emphasized that the need for surgery is dictated by the patient's quality of life as influenced by the cataract. 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[facility])    Past Surgical History:  Procedure Laterality Date   BACK SURGERY     EYE SURGERY      FAMILY HISTORY Family History  Problem Relation Age of Onset   Prostate cancer Maternal Grandfather    Sarcoidosis Maternal Uncle    Thyroid disease Mother    AAA (abdominal aortic aneurysm) Father    Heart attack Sister     SOCIAL HISTORY Social History   Tobacco Use   Smoking status: Never   Smokeless tobacco: Never  Vaping Use   Vaping Use: Never used  Substance Use Topics   Alcohol use: No   Drug  use: No         OPHTHALMIC EXAM:  Slit Lamp and Fundus Exam     External Exam       Right Left   External Normal Normal         Slit Lamp Exam       Right Left   Lids/Lashes Normal Normal   Conjunctiva/Sclera 1+ Injection White and quiet   Cornea Corneal iron line nearing the visual axis, which is circumferential to a curvilinear horizontally oriented subepithelial white material that is epithelial ingrowth in the previous ALK incision site from decades ago Clear   Anterior Chamber Deep and quiet Deep and quiet   Iris Round and reactive Round and reactive   Lens Posterior chamber intraocular lens 2+ Nuclear sclerosis   Anterior Vitreous Normal Normal         Fundus Exam       Right Left   Posterior  Vitreous Clear avitric, 90% gas    Disc Normal    C/D Ratio 0.2    Macula Through the gas, macular hole appears closed, no clear details however because of the cornea cloudiness    Vessels Normal    Periphery Normal             IMAGING AND PROCEDURES  Imaging and Procedures for 08/24/20           ASSESSMENT/PLAN:  Vitreomacular traction syndrome of right eye Resolved post vitrectomy     ICD-10-CM   1. Macular hole of right eye  H35.341     2. Vitreomacular traction syndrome of right eye  H43.821       1.  OD looks great 1 day postop vitrectomy membrane peel gas injection for macular hole, prior 450 m size.  Hole appears closed through the gas bubble yet not clinically detailed on clinical examination in the supine position nonetheless patient will be instructed to commence with eyedrops  2.  Topical medications prednisolone acetate 1 drop right eye 4 times daily  Ofloxacin antibiotic 1 drop right eye 4 times daily  3. Positioning patient encouraged to maintain while awake new reading position for the next 3 days.  He may also look face down directly on any manner in which he can find comfort for his back and left and neck.  Sleeping at night for the next 2 weeks to be on either side preferably (either side is okay but never the back.  Patient also instructed on the safety measures of no travel to mountains, or airplane travel or high elevators that is over 10 stories tall in the next 2 weeks  4.  Patient reports he has schedule appointment for corneal evaluation [facility] Dr. Doyce Para on 08/30/2020.  I encouraged the patient to maintain an appointment evaluation due to the difficulty of acquiring that evaluation.  I did ask also the patient to contact Dr. Hulen Luster office to schedule appointment in 2 to 3 weeks with him for follow-up   Ophthalmic Meds Ordered this visit:  No orders of the defined types were placed in this encounter.      Return  in about 1 week (around 08/31/2020) for dilate, OD, POST OP, OCT.  Patient Instructions  Ofloxacin  4 times daily to the operative eye  Prednisolone acetate 1 drop to the operative eye 4 times daily  Patient instructed not to refill the medications and use them for maximum of 3 weeks.  Patient instructed do not rub the eye.  Patient has the option to use the patch at night.   The patient was found to be doing well, postoperatively, and was advised in the use of drops and home care.  Patient was advised not to rub eyes. Positioning was described as well, as precautions regarding intravitreal gas, if applicable. DO NOT TRAVEL TO [place], OR IN AIRPLANE, UNTIL THE BUBBLE INSIDE THE EYE HAS DISAPPEARED.  The use of eye patch at night is optional and was discussed. May use paper tape with patch if patient has dry skin and transpore tape for oily skin.   Use topical medications as ordered.    Explained the diagnoses, plan, and follow up with the patient and they expressed understanding.  Patient expressed understanding of the importance of proper follow up care.   Clent Demark Donal Lynam M.D. Diseases & Surgery of the Retina and Vitreous Retina & Diabetic [place] 08/24/20     Abbreviations: M myopia (nearsighted); A astigmatism; H hyperopia (farsighted); P presbyopia; Mrx spectacle prescription;  CTL contact lenses; OD right eye; OS left eye; OU both eyes  XT exotropia; ET esotropia; PEK punctate epithelial keratitis; PEE punctate epithelial erosions; DES dry eye syndrome; MGD meibomian gland dysfunction; ATs artificial tears; PFAT's preservative free artificial tears; Protection nuclear sclerotic cataract; PSC posterior subcapsular cataract; ERM epi-retinal membrane; PVD posterior vitreous detachment; RD retinal detachment; DM diabetes mellitus; DR diabetic retinopathy; NPDR non-proliferative diabetic retinopathy; PDR proliferative diabetic retinopathy; CSME clinically significant macular edema; DME diabetic  macular edema; dbh dot blot hemorrhages; CWS cotton wool spot; POAG primary open angle glaucoma; C/D cup-to-disc ratio; HVF humphrey visual field; GVF goldmann visual field; OCT optical coherence tomography; IOP intraocular pressure; BRVO Branch retinal vein occlusion; CRVO central retinal vein occlusion; CRAO central retinal artery occlusion; BRAO branch retinal artery occlusion; RT retinal tear; SB scleral buckle; PPV pars plana vitrectomy; VH Vitreous hemorrhage; PRP panretinal laser photocoagulation; IVK intravitreal kenalog; VMT vitreomacular traction; MH Macular hole;  NVD neovascularization of the disc; NVE neovascularization elsewhere; AREDS age related eye disease study; ARMD age related macular degeneration; POAG primary open angle glaucoma; EBMD epithelial/anterior basement membrane dystrophy; ACIOL anterior chamber intraocular lens; IOL intraocular lens; PCIOL posterior chamber intraocular lens; Phaco/IOL phacoemulsification with intraocular lens placement; Knapp photorefractive keratectomy; LASIK laser assisted in situ keratomileusis; HTN hypertension; DM diabetes mellitus; COPD chronic obstructive pulmonary disease

## 2020-08-28 ENCOUNTER — Telehealth (INDEPENDENT_AMBULATORY_CARE_PROVIDER_SITE_OTHER): Payer: Self-pay

## 2020-08-31 ENCOUNTER — Ambulatory Visit (INDEPENDENT_AMBULATORY_CARE_PROVIDER_SITE_OTHER): Payer: 59 | Admitting: Ophthalmology

## 2020-08-31 ENCOUNTER — Other Ambulatory Visit: Payer: Self-pay

## 2020-08-31 ENCOUNTER — Encounter (INDEPENDENT_AMBULATORY_CARE_PROVIDER_SITE_OTHER): Payer: Self-pay | Admitting: Ophthalmology

## 2020-08-31 DIAGNOSIS — H35341 Macular cyst, hole, or pseudohole, right eye: Secondary | ICD-10-CM | POA: Diagnosis not present

## 2020-08-31 DIAGNOSIS — H35351 Cystoid macular degeneration, right eye: Secondary | ICD-10-CM

## 2020-08-31 DIAGNOSIS — Z09 Encounter for follow-up examination after completed treatment for conditions other than malignant neoplasm: Secondary | ICD-10-CM

## 2020-08-31 DIAGNOSIS — H43821 Vitreomacular adhesion, right eye: Secondary | ICD-10-CM

## 2020-08-31 NOTE — Assessment & Plan Note (Signed)
Macular hole closed today, 40% gas remains  Outer retinal gap is present thus I have asked the patient to use the gas bubble for 2 more days.  He has been instructed to look downward for 1 hour twice daily for the next 2 days including today  This may hasten the resolution of the subretinal fluid or the outer retinal gap to close further

## 2020-08-31 NOTE — Assessment & Plan Note (Signed)
As a component of macular hole now resolved 1 week post surgery post total closure

## 2020-08-31 NOTE — Assessment & Plan Note (Signed)
The patient was found to be doing well, postoperatively, and was advised in the use of drops and home care.  Patient was advised not to rub eyes. Positioning was described as well, as precautions regarding intravitreal gas, if applicable. DO NOT TRAVEL TO [place], OR IN AIRPLANE, UNTIL THE BUBBLE INSIDE THE EYE HAS DISAPPEARED.  The use of eye patch at night is optional and was discussed. May use paper tape with patch if patient has dry skin and transpore tape for oily skin.   Use topical medications as ordered.The patient was found to be doing well, postoperatively, and was advised in the use of drops and home care.  Patient was advised not to rub eyes. Positioning was described as well, as precautions regarding intravitreal gas, if applicable.   Macular hole surgery was explained with the need for a gas injection. The patient is aware of proper face down position (like looking downward naturally while  reading a book in a lap) for 3-5 days. Patient was advised to not lay on their back while sleeping or resting, usually for 2 weeks. Do not travel to places of elevation while gas bubble is in the eye, usually for 2 weeks

## 2020-08-31 NOTE — Patient Instructions (Signed)
Patient instructed to continue on ofloxacin topical medication antibiotic 1 drop 4 times daily for maximum of 2 more weeks and prednisolone acetate 1 drop right eye 4 times daily for maximum of 2 more weeks.  Patient instructed not to refill either medication should it be completed beforehand.  Patient instructed to discontinue all medications at the end of 2 weeks from this surgery.

## 2020-08-31 NOTE — Assessment & Plan Note (Signed)
Resolved post surgical intervention vitrectomy membrane peel for macular hole

## 2020-08-31 NOTE — Progress Notes (Signed)
08/31/2020     CHIEF COMPLAINT Patient presents for Post-op Follow-up (1 week post op OD. Sx on 08/23/2020. OCT/Pt states, "I think I am doing better. I am still seeing the line in my vision but I can tell that my bubble is getting smaller. I am having occasional discomfort and tearing. "/Pt reports using Ofloxacin , Pred and Ketorolac QID OD/Pt states that he was seen at [facility] yesterday for Corneal Eval/)   HISTORY OF PRESENT ILLNESS: Andrew Logan is a 65 y.o. male who presents to the clinic today for:   HPI     Post-op Follow-up           Laterality: right eye   Discomfort: pain (occasional discomfort), itching, foreign body sensation and floaters (Occasional floaters)   Vision: is improved   Comments: 1 week post op OD. Sx on 08/23/2020. OCT Pt states, "I think I am doing better. I am still seeing the line in my vision but I can tell that my bubble is getting smaller. I am having occasional discomfort and tearing. " Pt reports using Ofloxacin , Pred and Ketorolac QID OD Pt states that he was seen at [facility] yesterday for Corneal Eval        Last edited by Kendra Opitz, COA on 08/31/2020  8:26 AM.      Referring physician: Lin Landsman, MD [place],  [place] [id number]  HISTORICAL INFORMATION:   Selected notes from the MEDICAL RECORD NUMBER       CURRENT MEDICATIONS: Current Outpatient Medications (Ophthalmic Drugs)  Medication Sig   LUMIGAN 0.01 % SOLN Place 1 drop into both eyes at bedtime.   ofloxacin (OCUFLOX) 0.3 % ophthalmic solution Place 1 drop into the right eye every 4 (four) hours for 10 days.   No current facility-administered medications for this visit. (Ophthalmic Drugs)   Current Outpatient Medications (Other)  Medication Sig   Cholecalciferol (VITAMIN D PO) Take 1 capsule by mouth daily.   Cyanocobalamin (B-12 PO) Take 1 tablet by mouth daily.   diltiazem (CARDIZEM) 120 MG tablet Take 120 mg by mouth 2 (two) times daily.   famotidine  (PEPCID) 20 MG tablet One at bedtime   fluticasone (FLONASE) 50 MCG/ACT nasal spray Place 2 sprays into both nostrils daily as needed for allergies or rhinitis.   Melatonin 1 MG CHEW Chew 1 mg by mouth daily.   Multiple Vitamins-Minerals (MENS 50+ ADVANCED PO) Take 1 capsule by mouth daily.   TURMERIC PO Take 1 capsule by mouth daily.   zolpidem (AMBIEN) 10 MG tablet Take 10 mg by mouth at bedtime as needed. (Patient not taking: Reported on 11/05/2019)   No current facility-administered medications for this visit. (Other)      REVIEW OF SYSTEMS:    ALLERGIES No Known Allergies  PAST MEDICAL HISTORY Past Medical History:  Diagnosis Date   Cataract, nuclear sclerotic, right eye 07/19/2020   The nature of cataract was discussed with the patient as well as the elective nature of surgery. The patient was reassured that surgery at a later date does not put the patient at risk for a worse outcome. It was emphasized that the need for surgery is dictated by the patient's quality of life as influenced by the cataract. 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[facility])    Past Surgical History:  Procedure Laterality Date   BACK SURGERY     EYE SURGERY      FAMILY HISTORY Family History  Problem Relation Age of Onset   Prostate cancer Maternal Grandfather    Sarcoidosis Maternal Uncle    Thyroid disease Mother    AAA (abdominal aortic aneurysm) Father    Heart attack Sister     SOCIAL HISTORY Social History   Tobacco Use   Smoking status: Never   Smokeless tobacco: Never  Vaping Use   Vaping Use: Never used  Substance Use Topics   Alcohol use: No   Drug use: No         OPHTHALMIC EXAM:  Base Eye Exam     Visual Acuity (ETDRS)       Right Left   Dist cc 20/80 -2 20/25 -2   Dist ph cc NI          Tonometry (Tonopen, 8:29 AM)       Right Left   Pressure  16 13         Pupils       Pupils Dark Light Shape React APD   Right PERRL 4 3 Round Brisk None   Left PERRL 4 3 Round Brisk None         Dilation     Right eye: 1.0% Mydriacyl, 2.5% Phenylephrine @ 8:29 AM           Slit Lamp and Fundus Exam     External Exam       Right Left   External Normal Normal         Slit Lamp Exam       Right Left   Lids/Lashes Normal Normal   Conjunctiva/Sclera 1+ Injection White and quiet   Cornea Corneal iron line nearing the visual axis, which is circumferential to a curvilinear horizontally oriented subepithelial white material that is epithelial ingrowth in the previous ALK incision site from decades ago Clear   Anterior Chamber Deep and quiet Deep and quiet   Iris Round and reactive Round and reactive   Lens Posterior chamber intraocular lens 2+ Nuclear sclerosis   Anterior Vitreous Normal Normal         Fundus Exam       Right Left   Posterior Vitreous Clear avitric, 40% gas    Disc Normal    C/D Ratio 0.2    Macula Macular hole closed     Vessels Normal    Periphery Normal             IMAGING AND PROCEDURES  Imaging and Procedures for 08/31/20  OCT, Retina - OU - Both Eyes       Right Eye Quality was good. Scan locations included subfoveal. Central Foveal Thickness: 392. Progression has been stable. Findings include abnormal foveal contour.   Left Eye Quality was good. Scan locations included subfoveal. Central Foveal Thickness: 257. Progression has been stable. Findings include normal foveal contour.   Notes 1 week post vitrectomy membrane peel for macular hole.  Macular hole is nicely closed.  Perifoveal CME has resolved as a result.  There is an outer gap in the photoreceptor layer.  This outer retinal gap is much smaller than preop, this now has ia chance to close over time.  OS normal foveal contour             ASSESSMENT/PLAN:  Cystoid macular edema of right eye As a component of  macular hole now resolved 1 week post  surgery post total closure  Macular hole of right eye Macular hole closed today, 40% gas remains  Outer retinal gap is present thus I have asked the patient to use the gas bubble for 2 more days.  He has been instructed to look downward for 1 hour twice daily for the next 2 days including today  This may hasten the resolution of the subretinal fluid or the outer retinal gap to close further  Vitreomacular traction syndrome of right eye Resolved post surgical intervention vitrectomy membrane peel for macular hole     ICD-10-CM   1. Macular hole of right eye  H35.341 OCT, Retina - OU - Both Eyes    2. Cystoid macular edema of right eye  H35.351     3. Vitreomacular traction syndrome of right eye  H43.821     4. Postoperative follow-up  Z09       1.  OD looks great 1 week post vitrectomy membrane peel for macular hole.  Visual acuity has improved from preoperative 20/400 to now 20 /80 at only 1 week.  This is an excellent result so far  2.  Patient completed his evaluation [facility], corneal service, Dr. Doyce Para.  Patient should continue to follow his instructions as outlined in previous matter regarding the corneal and its attempt to maximize visual potential  3.  Ophthalmic Meds Ordered this visit:  No orders of the defined types were placed in this encounter.      Return in about 8 weeks (around 10/26/2020) for dilate, OD, OCT.  Patient Instructions  Patient instructed to continue on ofloxacin topical medication antibiotic 1 drop 4 times daily for maximum of 2 more weeks and prednisolone acetate 1 drop right eye 4 times daily for maximum of 2 more weeks.  Patient instructed not to refill either medication should it be completed beforehand.  Patient instructed to discontinue all medications at the end of 2 weeks from this surgery.   Explained the diagnoses, plan, and follow up with the patient and they expressed understanding.   Patient expressed understanding of the importance of proper follow up care.   Clent Demark Urias Sheek M.D. Diseases & Surgery of the Retina and Vitreous Retina & Diabetic [place] 08/31/20     Abbreviations: M myopia (nearsighted); A astigmatism; H hyperopia (farsighted); P presbyopia; Mrx spectacle prescription;  CTL contact lenses; OD right eye; OS left eye; OU both eyes  XT exotropia; ET esotropia; PEK punctate epithelial keratitis; PEE punctate epithelial erosions; DES dry eye syndrome; MGD meibomian gland dysfunction; ATs artificial tears; PFAT's preservative free artificial tears; Page nuclear sclerotic cataract; PSC posterior subcapsular cataract; ERM epi-retinal membrane; PVD posterior vitreous detachment; RD retinal detachment; DM diabetes mellitus; DR diabetic retinopathy; NPDR non-proliferative diabetic retinopathy; PDR proliferative diabetic retinopathy; CSME clinically significant macular edema; DME diabetic macular edema; dbh dot blot hemorrhages; CWS cotton wool spot; POAG primary open angle glaucoma; C/D cup-to-disc ratio; HVF humphrey visual field; GVF goldmann visual field; OCT optical coherence tomography; IOP intraocular pressure; BRVO Branch retinal vein occlusion; CRVO central retinal vein occlusion; CRAO central retinal artery occlusion; BRAO branch retinal artery occlusion; RT retinal tear; SB scleral buckle; PPV pars plana vitrectomy; VH Vitreous hemorrhage; PRP panretinal laser photocoagulation; IVK intravitreal kenalog; VMT vitreomacular traction; MH Macular hole;  NVD neovascularization of the disc; NVE neovascularization elsewhere; AREDS age related eye disease study; ARMD age related macular degeneration; POAG primary open angle glaucoma; EBMD epithelial/anterior basement membrane dystrophy; ACIOL anterior chamber intraocular lens; IOL intraocular  lens; PCIOL posterior chamber intraocular lens; Phaco/IOL phacoemulsification with intraocular lens placement; [place]aird photorefractive  keratectomy; LASIK laser assisted in situ keratomileusis; HTN hypertension; DM diabetes mellitus; COPD chronic obstructive pulmonary disease

## 2020-09-25 ENCOUNTER — Ambulatory Visit: Payer: 59 | Admitting: Cardiology

## 2020-09-25 ENCOUNTER — Other Ambulatory Visit: Payer: Self-pay

## 2020-09-25 ENCOUNTER — Encounter: Payer: Self-pay | Admitting: Cardiology

## 2020-09-25 VITALS — BP 144/80 | HR 51 | Resp 16 | Ht 73.0 in | Wt 229.2 lb

## 2020-09-25 DIAGNOSIS — R9431 Abnormal electrocardiogram [ECG] [EKG]: Secondary | ICD-10-CM

## 2020-09-25 DIAGNOSIS — D860 Sarcoidosis of lung: Secondary | ICD-10-CM

## 2020-09-25 DIAGNOSIS — R001 Bradycardia, unspecified: Secondary | ICD-10-CM

## 2020-09-25 DIAGNOSIS — I10 Essential (primary) hypertension: Secondary | ICD-10-CM

## 2020-09-25 DIAGNOSIS — Z8616 Personal history of COVID-19: Secondary | ICD-10-CM

## 2020-09-25 DIAGNOSIS — R072 Precordial pain: Secondary | ICD-10-CM

## 2020-09-25 MED ORDER — AMLODIPINE BESYLATE 5 MG PO TABS: 5.0000 mg | ORAL_TABLET | Freq: Every day | ORAL | 0 refills | Status: DC

## 2020-09-25 MED ORDER — METOPROLOL TARTRATE 25 MG PO TABS: 25.0000 mg | ORAL_TABLET | Freq: Two times a day (BID) | ORAL | 0 refills | Status: DC

## 2020-09-25 NOTE — Progress Notes (Signed)
ID:  Andrew Logan, DOB August 31, 1955, MRN [id number]  PCP:  Leilani Able, MD  Cardiologist:  Tessa Lerner, DO, [facility] (established care 08/17/2019) Former Cardiology Providers: None  Date: 09/25/20 Last Office Visit: 11/05/2019   Chief Complaint  Patient presents with   Follow-up   Chest discomfort     HPI  Andrew Logan is a 65 y.o. male who presents to the office with a chief complaint of " 43-month follow-up and discuss recent chest discomfort.." Patient's past medical history and cardiovascular risk factors include: Hx of Covid 19 infection, Hypertension, pulmonary sarcoidosis, diastolic dysfunction, obesity due to excess calories.  Sarcoidosis of lung was diagnosed in 1998 and has had flare up in the past (I.e. 2003 and 2006). He use to follow up with pulmonary medicine last evaluation was approximately 2 years ago.  Chest pain: Patient presents today sooner than his scheduled appointment given his symptoms of chest pain.  This discomfort is located on the right side, intensity 4-10, lasting for approximately 10 minutes, no improving or worsening factors, not brought on by effort related activities, does not resolve with rest, usually self-limited.  Patient states that he has been under a lot of emotional stress as few family members have passed and multiple church members have passed away which may be contributing to his discomfort.  In addition, he has been having chest pain and headaches with sexual intercourse which is new compared to in the past.  Patient states that his blood pressure is elevated today most likely secondary to not taking his medications.  He has a tendency of forgetting at times as well.  Patient stated that he recently had 2 eye surgeries and during both of those times he was noted to have bradycardia though overall asymptomatic.   FUNCTIONAL STATUS: Works out on treadmill two days a week for atleast 32 minutes each time.    ALLERGIES: No Known  Allergies  MEDICATION LIST PRIOR TO VISIT: Current Meds  Medication Sig   amLODipine (NORVASC) 5 MG tablet Take 1 tablet (5 mg total) by mouth daily.   Cholecalciferol (VITAMIN D PO) Take 1 capsule by mouth daily.   Cyanocobalamin (B-12 PO) Take 1 tablet by mouth daily.   famotidine (PEPCID) 20 MG tablet One at bedtime (Patient taking differently: Take 20 mg by mouth as needed. One at bedtime)   fluticasone (FLONASE) 50 MCG/ACT nasal spray Place 2 sprays into both nostrils daily as needed for allergies or rhinitis.   LUMIGAN 0.01 % SOLN Place 1 drop into both eyes at bedtime.   metoprolol tartrate (LOPRESSOR) 25 MG tablet Take 1 tablet (25 mg total) by mouth 2 (two) times daily for 10 days. Start two days before your CT scan.   Multiple Vitamins-Minerals (MENS 50+ ADVANCED PO) Take 1 capsule by mouth daily.   TURMERIC PO Take 1 capsule by mouth daily.   zolpidem (AMBIEN) 10 MG tablet Take 10 mg by mouth at bedtime as needed.   [DISCONTINUED] diltiazem (CARDIZEM) 120 MG tablet Take 120 mg by mouth daily.     PAST MEDICAL HISTORY: Past Medical History:  Diagnosis Date   Cataract, nuclear sclerotic, right eye 07/19/2020   The nature of cataract was discussed with the patient as well as the elective nature of surgery. The patient was reassured that surgery at a later date does not put the patient at risk for a worse outcome. It was emphasized that the need for surgery is dictated by the patient's quality of life as influenced by  the cataract. 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(HCC)     PAST SURGICAL HISTORY: Past Surgical History:  Procedure Laterality Date   BACK SURGERY     EYE SURGERY      FAMILY HISTORY: The patient family history includes AAA (abdominal aortic aneurysm) in his father; Heart attack in his sister; Prostate cancer in his maternal grandfather;  Sarcoidosis in his maternal uncle; Thyroid disease in his mother.  SOCIAL HISTORY:  The patient  reports that he has never smoked. He has never used smokeless tobacco. He reports that he does not drink alcohol and does not use drugs.  REVIEW OF SYSTEMS: Review of Systems  Constitutional: Negative for chills and fever.  HENT:  Negative for hoarse voice and nosebleeds.   Eyes:  Negative for discharge, double vision and pain.  Cardiovascular:  Positive for chest pain. Negative for claudication, dyspnea on exertion, irregular heartbeat, leg swelling, near-syncope, orthopnea, palpitations, paroxysmal nocturnal dyspnea and syncope.  Respiratory:  Negative for hemoptysis and shortness of breath.   Musculoskeletal:  Negative for muscle cramps and myalgias.  Gastrointestinal:  Negative for abdominal pain, constipation, diarrhea, hematemesis, hematochezia, melena, nausea and vomiting.  Neurological:  Negative for dizziness and light-headedness.   PHYSICAL EXAM: Vitals with BMI 09/25/2020 11/05/2019 09/28/2019  Height 6\' 1"  6\' 1"  6\' 1"   Weight 229 lbs 3 oz 230 lbs 231 lbs  BMI 30.25 30.35 30.48  Systolic 144 128  Diastolic 80 78 77  Pulse 51 54 60   CONSTITUTIONAL: Well-developed and well-nourished. No acute distress.  SKIN: Skin is warm and dry. No rash noted. No cyanosis. No pallor. No jaundice HEAD: Normocephalic and atraumatic.  EYES: No scleral icterus MOUTH/THROAT: Moist oral membranes.  NECK: No JVD present. No thyromegaly noted. No carotid bruits  LYMPHATIC: No visible cervical adenopathy.  CHEST Normal respiratory effort. No intercostal retractions  LUNGS: Clear to auscultation bilaterally.  No stridor. No wheezes. No rales.  CARDIOVASCULAR: Regular rate and rhythm, positive S1-S2, no murmurs rubs or gallops appreciated. ABDOMINAL: Soft, nontender, nondistended, positive bowel sounds in all 4 quadrants.  No apparent ascites.  EXTREMITIES: Trace bilateral peripheral edema   HEMATOLOGIC: No significant bruising NEUROLOGIC: Oriented to person, place, and time. Nonfocal. Normal muscle tone.  PSYCHIATRIC: Normal mood and affect. Normal behavior. Cooperative  CARDIAC DATABASE: EKG: 09/25/2020: Sinus bradycardia, 47 bpm, left axis, nonspecific T wave abnormality, without underlying injury pattern.  Echocardiogram: 08/30/2019: LVEF 60%, moderate LVH, grade 2 diastolic impairment, elevated LAP, mildly dilated left atrium, trace AR, mild MR.  Stress Testing: Lexiscan Sestamibi stress test 09/06/2019: Lexiscan nuclear stress test performed using 1-day protocol. Normal myocardial perfusion. Stress LVEF 65%. Low risk study.  Heart Catheterization: None  LABORATORY DATA: CBC Latest Ref Rng & Units 03/04/2017 09/28/2016  WBC 4.0 - 10.5 K/uL 6.1 6.1  Hemoglobin 13.0 - 17.0 g/dL 09/08/2019 03/06/2017  Hematocrit 11/28/2016 - 52.0 % 42.1 43.6  Platelets 150.0 - 400.0 K/uL 222.0 188    CMP Latest Ref Rng & Units 11/03/2019 09/28/2019 09/28/2016  Glucose 65 - 99 mg/dL 80 87 11/05/2019)  BUN 8 - 27 mg/dL 13 14 12   Creatinine 0.76 - 1.27 mg/dL 11/28/2019) 11/28/2016) 119(E)  Sodium 134 - 144 mmol/L 141 138 137  Potassium 3.5 - 5.2 mmol/L 4.8 4.7 4.7  Chloride 96 - 106 mmol/L 102 100 105  CO2 20 -  29 mmol/L 27 20 25   Calcium 8.6 - 10.2 mg/dL 9.5 04.510.0 9.5    Lipid Panel  No results found for: CHOL, TRIG, HDL, CHOLHDL, VLDL, LDLCALC, LDLDIRECT, LABVLDL  No components found for: NTPROBNP No results for input(s): PROBNP in the last 8760 hours. No results for input(s): TSH in the last 8760 hours.  BMP Recent Labs    09/28/19 1504 11/03/19 1523  NA 138 141  K 4.7 4.8  CL 100 102  CO2 20 27  GLUCOSE 87 80  BUN 14 13  CREATININE 1.45* 1.51*  CALCIUM 10.0 9.5  GFRNONAA 51* 48*  GFRAA 59* 56*    HEMOGLOBIN A1C No results found for: HGBA1C, MPG  IMPRESSION:    ICD-10-CM   1. Precordial chest pain  R07.2 EKG 12-Lead    CT CORONARY MORPH W/CTA COR W/SCORE W/CA W/CM &/OR WO/CM     metoprolol tartrate (LOPRESSOR) 25 MG tablet    2. Bradycardia  R00.1     3. Benign hypertension  I10 amLODipine (NORVASC) 5 MG tablet    Basic metabolic panel    Magnesium    4. History of COVID-19  Z86.16     5. Pulmonary sarcoidosis (HCC)  D86.0     6. Nonspecific abnormal electrocardiogram (ECG) (EKG)  R94.31 CT CORONARY MORPH W/CTA COR W/SCORE W/CA W/CM &/OR WO/CM       RECOMMENDATIONS: Linda Hedgesllen Katzman is a 65 y.o. male whose past medical history and cardiac risk factors include: Hx of Covid 19 infection, Hypertension, pulmonary sarcoidosis, diastolic dysfunction, obesity due to excess calories.  Precordial chest pain: Can possibly be cardiac in etiology, more pronounced compared to his last ischemic evaluation, and now also present with over exertional activities such as sexual intercourse and recent loss of loved ones. Educated on the importance of blood pressure management. We will proceed with coronary CTA to evaluate for obstructive CAD given the patient's symptoms and nonspecific EKG findings. Start Lopressor 25 mg p.o. twice daily a week prior to his CT scan.  Asymptomatic bradycardia: Noted to have bradycardia and multiple occasions. We will discontinue diltiazem. Monitor for now.  Benign essential hypertension: Office blood pressures are not well controlled most likely secondary to skipping his morning medication. As discussed above we will discontinue diltiazem due to bradycardia and will started on amlodipine 5 mg p.o. daily. Low-salt diet recommended We will check a BMP and magnesium level.  And based on renal function will consider additional antihypertensive medications until he has a chance to follow-up with his PCP.  Pulmonary sarcoidosis: Patient is asked to follow-up with the pulmonologist as per his PCPs recommendation.  FINAL MEDICATION LIST END OF ENCOUNTER: Meds ordered this encounter  Medications   amLODipine (NORVASC) 5 MG tablet    Sig: Take 1  tablet (5 mg total) by mouth daily.    Dispense:  90 tablet    Refill:  0   metoprolol tartrate (LOPRESSOR) 25 MG tablet    Sig: Take 1 tablet (25 mg total) by mouth 2 (two) times daily for 10 days. Start two days before your CT scan.    Dispense:  20 tablet    Refill:  0      Current Outpatient Medications:    amLODipine (NORVASC) 5 MG tablet, Take 1 tablet (5 mg total) by mouth daily., Disp: 90 tablet, Rfl: 0   Cholecalciferol (VITAMIN D PO), Take 1 capsule by mouth daily., Disp: , Rfl:    Cyanocobalamin (B-12 PO), Take 1 tablet by mouth daily.,  Disp: , Rfl:    famotidine (PEPCID) 20 MG tablet, One at bedtime (Patient taking differently: Take 20 mg by mouth as needed. One at bedtime), Disp: , Rfl:    fluticasone (FLONASE) 50 MCG/ACT nasal spray, Place 2 sprays into both nostrils daily as needed for allergies or rhinitis., Disp: , Rfl:    LUMIGAN 0.01 % SOLN, Place 1 drop into both eyes at bedtime., Disp: , Rfl:    metoprolol tartrate (LOPRESSOR) 25 MG tablet, Take 1 tablet (25 mg total) by mouth 2 (two) times daily for 10 days. Start two days before your CT scan., Disp: 20 tablet, Rfl: 0   Multiple Vitamins-Minerals (MENS 50+ ADVANCED PO), Take 1 capsule by mouth daily., Disp: , Rfl:    TURMERIC PO, Take 1 capsule by mouth daily., Disp: , Rfl:    zolpidem (AMBIEN) 10 MG tablet, Take 10 mg by mouth at bedtime as needed., Disp: , Rfl:   Orders Placed This Encounter  Procedures   CT CORONARY MORPH W/CTA COR W/SCORE W/CA W/CM &/OR WO/CM   Basic metabolic panel   Magnesium   EKG 12-Lead    There are no Patient Instructions on file for this visit.   --Continue cardiac medications as reconciled in final medication list. --Return in about 4 weeks (around 10/23/2020) for Follow up, Chest pain,review ccta. Or sooner if needed. --Continue follow-up with your primary care physician regarding the management of your other chronic comorbid conditions.  Patient's questions and concerns were  addressed to his satisfaction. He voices understanding of the instructions provided during this encounter.   This note was created using a voice recognition software as a result there may be grammatical errors inadvertently enclosed that do not reflect the nature of this encounter. Every attempt is made to correct such errors.  Tessa Lerner, [place], [facility]  Pager: [phone] Office: [phone]

## 2020-09-26 ENCOUNTER — Encounter (INDEPENDENT_AMBULATORY_CARE_PROVIDER_SITE_OTHER): Payer: Self-pay | Admitting: Ophthalmology

## 2020-09-26 ENCOUNTER — Ambulatory Visit (INDEPENDENT_AMBULATORY_CARE_PROVIDER_SITE_OTHER): Payer: 59 | Admitting: Ophthalmology

## 2020-09-26 DIAGNOSIS — H2512 Age-related nuclear cataract, left eye: Secondary | ICD-10-CM

## 2020-09-26 DIAGNOSIS — H179 Unspecified corneal scar and opacity: Secondary | ICD-10-CM

## 2020-09-26 DIAGNOSIS — H43821 Vitreomacular adhesion, right eye: Secondary | ICD-10-CM

## 2020-09-26 DIAGNOSIS — Z09 Encounter for follow-up examination after completed treatment for conditions other than malignant neoplasm: Secondary | ICD-10-CM

## 2020-09-26 DIAGNOSIS — H35351 Cystoid macular degeneration, right eye: Secondary | ICD-10-CM

## 2020-09-26 DIAGNOSIS — H35341 Macular cyst, hole, or pseudohole, right eye: Secondary | ICD-10-CM

## 2020-09-26 LAB — BASIC METABOLIC PANEL
BUN/Creatinine Ratio: 7 — ABNORMAL LOW (ref 10–24)
BUN: 11 mg/dL (ref 8–27)
CO2: 24 mmol/L (ref 20–29)
Calcium: 9.7 mg/dL (ref 8.6–10.2)
Chloride: 99 mmol/L (ref 96–106)
Creatinine, Ser: 1.5 mg/dL — ABNORMAL HIGH (ref 0.76–1.27)
Glucose: 93 mg/dL (ref 65–99)
Potassium: 4.4 mmol/L (ref 3.5–5.2)
Sodium: 139 mmol/L (ref 134–144)
eGFR: 52 mL/min/{1.73_m2} — ABNORMAL LOW (ref >59)

## 2020-09-26 LAB — MAGNESIUM: Magnesium: 1.9 mg/dL (ref 1.6–2.3)

## 2020-09-26 NOTE — Assessment & Plan Note (Signed)
Follow-up with Dr. Charlton Haws as scheduled

## 2020-09-26 NOTE — Assessment & Plan Note (Signed)
Resolved post macular hole closure

## 2020-09-26 NOTE — Assessment & Plan Note (Signed)
Patient reports that he is reluctant to Proceed with the left eye because he is waiting for the right eye to improve.  I explained the patient that the macular hole slowly repairing itself and the healing process can take upwards of 1 year.  The hole is closed perifoveal CME resolved and the hole is much smaller at the outer retinal layers.  This is likely to proceed in a slow fashion of the improvement.  I explained to him that this in no way affects any decisions he makes so that the balancing of the 2 eyes from a spectacle correction may occur  I point out the patient is vision is improved from 2400s in the right eye preoperatively to now best acuity of 20/60 when looking through the pinhole testing

## 2020-09-26 NOTE — Progress Notes (Signed)
09/26/2020     CHIEF COMPLAINT Patient presents for Post-op Follow-up (Post op od oct sx 09/02/2020 (Called for an earlier appt because he has questions)/Pt states, "I wanted to make sure that I was on the right track with my recovery because I am considering having CEIOl sx on OS 11/09/20. /I am still not able to read well out of OD and I do have occasional itching and pressure.")   HISTORY OF PRESENT ILLNESS: Andrew Logan is a 65 y.o. male who presents to the clinic today for:   HPI     Post-op Follow-up           Laterality: right eye   Discomfort: pain ("At times the eye will have pressure but it is not constant") and itching (Occasional itching on and off).  Negative for foreign body sensation   Vision: is stable   Comments: Post op od oct sx 09/02/2020 (Called for an earlier appt because he has questions) Pt states, "I wanted to make sure that I was on the right track with my recovery because I am considering having CEIOl sx on OS 11/09/20.  I am still not able to read well out of OD and I do have occasional itching and pressure."       Last edited by Kendra Opitz, COA on 09/26/2020  3:54 PM.      Referring physician: Lin Landsman, MD [place],  [place] [address]  HISTORICAL INFORMATION:   Selected notes from the [place]: Current Outpatient Medications (Ophthalmic Drugs)  Medication Sig   LUMIGAN 0.01 % SOLN Place 1 drop into both eyes at bedtime.   No current facility-administered medications for this visit. (Ophthalmic Drugs)   Current Outpatient Medications (Other)  Medication Sig   amLODipine (NORVASC) 5 MG tablet Take 1 tablet (5 mg total) by mouth daily.   Cholecalciferol (VITAMIN D PO) Take 1 capsule by mouth daily.   Cyanocobalamin (B-12 PO) Take 1 tablet by mouth daily.   famotidine (PEPCID) 20 MG tablet One at bedtime (Patient taking differently: Take 20 mg by mouth as needed. One at bedtime)    fluticasone (FLONASE) 50 MCG/ACT nasal spray Place 2 sprays into both nostrils daily as needed for allergies or rhinitis.   metoprolol tartrate (LOPRESSOR) 25 MG tablet Take 1 tablet (25 mg total) by mouth 2 (two) times daily for 10 days. Start two days before your CT scan.   Multiple Vitamins-Minerals (MENS 50+ ADVANCED PO) Take 1 capsule by mouth daily.   TURMERIC PO Take 1 capsule by mouth daily.   zolpidem (AMBIEN) 10 MG tablet Take 10 mg by mouth at bedtime as needed.   No current facility-administered medications for this visit. (Other)      REVIEW OF SYSTEMS:    ALLERGIES No Known Allergies  PAST MEDICAL HISTORY Past Medical History:  Diagnosis Date   Cataract, nuclear sclerotic, right eye 07/19/2020   The nature of cataract was discussed with the patient as well as the elective nature of surgery. The patient was reassured that surgery at a later date does not put the patient at risk for a worse outcome. It was emphasized that the need for surgery is dictated by the patient's quality of life as influenced by the cataract. Patient was instructed to maintain close follow up with their general eye    Cystoid macular edema of right eye 07/19/2020   Hypertension    Pneumonia  greater than 3 years   Pulmonary sarcoidosis (HCC)    Past Surgical History:  Procedure Laterality Date   BACK SURGERY     EYE SURGERY      FAMILY HISTORY Family History  Problem Relation Age of Onset   Prostate cancer Maternal Grandfather    Sarcoidosis Maternal Uncle    Thyroid disease Mother    AAA (abdominal aortic aneurysm) Father    Heart attack Sister     SOCIAL HISTORY Social History   Tobacco Use   Smoking status: Never   Smokeless tobacco: Never  Vaping Use   Vaping Use: Never used  Substance Use Topics   Alcohol use: No   Drug use: No         OPHTHALMIC EXAM:  Base Eye Exam     Visual Acuity (ETDRS)       Right Left   Dist cc 20/100 20/25 -2   Dist ph cc 20/60 -2           Tonometry (Tonopen, 3:58 PM)       Right Left   Pressure 14 11         Pupils       Pupils Dark Light Shape React APD   Right PERRL 4 3 Round Brisk None   Left PERRL 4 3 Round Brisk None         Extraocular Movement       Right Left    Full Full         Neuro/Psych     Oriented x3: Yes         Dilation     Right eye: 1.0% Mydriacyl, 2.5% Phenylephrine @ 3:58 PM           Slit Lamp and Fundus Exam     External Exam       Right Left   External Normal Normal         Slit Lamp Exam       Right Left   Lids/Lashes Normal Normal   Conjunctiva/Sclera 1+ Injection White and quiet   Cornea Corneal iron line nearing the visual axis, which is circumferential to a curvilinear horizontally oriented subepithelial white material that is epithelial ingrowth in the previous ALK incision site from decades ago Clear   Anterior Chamber Deep and quiet Deep and quiet   Iris Round and reactive Round and reactive   Lens Posterior chamber intraocular lens  2+ Nuclear sclerosis   Anterior Vitreous Normal Normal         Fundus Exam       Right Left   Posterior Vitreous Clear avitric,    Disc Normal    C/D Ratio 0.2    Macula Macular hole closed     Vessels Normal    Periphery Normal             IMAGING AND PROCEDURES  Imaging and Procedures for 09/26/20  OCT, Retina - OU - Both Eyes       Right Eye Quality was good. Scan locations included subfoveal. Central Foveal Thickness: 284. Progression has improved. Findings include abnormal foveal contour.   Left Eye Quality was good. Scan locations included subfoveal. Findings include normal observations.   Notes Macular hole OD, closed with coaptation of the inner retina with a outer retinal gap that remains of an 159 m at the outer photoreceptor layer as compared to preoperative of 484 m gap, thus vastly improved with no perifoveal CME remaining.  ASSESSMENT/PLAN:  Corneal scar, right eye Follow-up with Dr. Richardson Chiquito as scheduled  Cataract, nuclear sclerotic, left eye Patient reports that he is reluctant to Proceed with the left eye because he is waiting for the right eye to improve.  I explained the patient that the macular hole slowly repairing itself and the healing process can take upwards of 1 year.  The hole is closed perifoveal CME resolved and the hole is much smaller at the outer retinal layers.  This is likely to proceed in a slow fashion of the improvement.  I explained to him that this in no way affects any decisions he makes so that the balancing of the 2 eyes from a spectacle correction may occur  I point out the patient is vision is improved from 2400s in the right eye preoperatively to now best acuity of 20/60 when looking through the pinhole testing  Cystoid macular edema of right eye Resolved post macular hole closure  Vitreomacular traction syndrome of right eye Resolved post surgery     ICD-10-CM   1. Macular hole of right eye  H35.341     2. Postoperative follow-up  Z09 OCT, Retina - OU - Both Eyes    3. 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 Cataract, nuclear sclerotic, left eye  H25.12     5. Cystoid macular edema of right eye  H35.351     6. Vitreomacular traction syndrome of right eye  H43.821       1.  Follow-up completely with Dr. Duanne Guess and here in 6 months simply for OCT.  2.  Patient says he is does not have sleep apnea nor does he snore thus do not believe that nightly macular hypoxia is present or at risk given his denial for sleep apnea or symptoms thereof  3.  Corneal scarring, follow-up with Dr. Duanne Guess and [facility] Dr. Doyce Para as scheduled hereafter  Ophthalmic Meds Ordered this visit:  No orders of the defined types were placed in this encounter.      Return in about 6 months (around 03/29/2021) for DILATE OU.  There are no Patient Instructions  on file for this visit.   Explained the diagnoses, plan, and follow up with the patient and they expressed understanding.  Patient expressed understanding of the importance of proper follow up care.   Clent Demark Chuckie Mccathern M.D. Diseases & Surgery of the Retina and Vitreous [facility] [place] 09/26/20     Abbreviations: M myopia (nearsighted); A astigmatism; H hyperopia (farsighted); P presbyopia; Mrx spectacle prescription;  CTL contact lenses; OD right eye; OS left eye; OU both eyes  XT exotropia; ET esotropia; PEK punctate epithelial keratitis; PEE punctate epithelial erosions; DES dry eye syndrome; MGD meibomian gland dysfunction; ATs artificial tears; PFAT's preservative free artificial tears; [place] nuclear sclerotic cataract; PSC posterior subcapsular cataract; ERM epi-retinal membrane; PVD posterior vitreous detachment; RD retinal detachment; DM diabetes mellitus; DR diabetic retinopathy; NPDR non-proliferative diabetic retinopathy; PDR proliferative diabetic retinopathy; CSME clinically significant macular edema; DME diabetic macular edema; dbh dot blot hemorrhages; CWS cotton wool spot; POAG primary open angle glaucoma; C/D cup-to-disc ratio; HVF humphrey visual field; GVF goldmann visual field; OCT optical coherence tomography; IOP intraocular pressure; BRVO Branch retinal vein occlusion; CRVO central retinal vein occlusion; CRAO central retinal artery occlusion; BRAO branch retinal artery occlusion; RT retinal tear; SB scleral buckle; PPV pars plana vitrectomy; VH Vitreous hemorrhage; PRP panretinal laser photocoagulation; IVK intravitreal kenalog; VMT  vitreomacular traction; MH Macular hole;  NVD neovascularization of the disc; NVE neovascularization elsewhere; AREDS age related eye disease study; ARMD age related macular degeneration; POAG primary open angle glaucoma; EBMD epithelial/anterior basement membrane dystrophy; ACIOL anterior chamber intraocular lens; IOL intraocular lens; PCIOL  posterior chamber intraocular lens; Phaco/IOL phacoemulsification with intraocular lens placement; [place] photorefractive keratectomy; LASIK laser assisted in situ keratomileusis; HTN hypertension; DM diabetes mellitus; COPD chronic obstructive pulmonary disease

## 2020-09-26 NOTE — Assessment & Plan Note (Signed)
Resolved post surgery ?

## 2020-10-06 ENCOUNTER — Telehealth (HOSPITAL_COMMUNITY): Payer: Self-pay | Admitting: Emergency Medicine

## 2020-10-06 NOTE — Telephone Encounter (Signed)
Reaching out to patient to offer assistance regarding upcoming cardiac imaging study; pt verbalizes understanding of appt date/time, parking situation and where to check in, pre-test NPO status and medications ordered, and verified current allergies; name and call back number provided for further questions should they arise Melaysia Streed RN Navigator Cardiac Imaging [facility] [phone] office [phone] cell  Denies IV issues Denies claustro  

## 2020-10-09 ENCOUNTER — Ambulatory Visit (HOSPITAL_COMMUNITY)
Admission: RE | Admit: 2020-10-09 | Discharge: 2020-10-09 | Disposition: A | Discharge status: Home / Self Care | Payer: 59 | Source: Ambulatory Visit | Attending: Family Medicine | Admitting: Family Medicine

## 2020-10-09 ENCOUNTER — Other Ambulatory Visit: Payer: Self-pay

## 2020-10-09 DIAGNOSIS — R072 Precordial pain: Secondary | ICD-10-CM | POA: Diagnosis present

## 2020-10-09 DIAGNOSIS — R9431 Abnormal electrocardiogram [ECG] [EKG]: Secondary | ICD-10-CM | POA: Insufficient documentation

## 2020-10-09 MED ORDER — NITROGLYCERIN 0.4 MG SL SUBL
0.8000 mg | SUBLINGUAL_TABLET | Freq: Once | SUBLINGUAL | Status: AC
  Administered 2020-10-09: 0.8 mg via SUBLINGUAL

## 2020-10-09 MED ORDER — NITROGLYCERIN 0.4 MG SL SUBL
SUBLINGUAL_TABLET | SUBLINGUAL | Status: AC
  Filled 2020-10-09: qty 2

## 2020-10-09 MED ORDER — IOHEXOL 350 MG/ML SOLN
95.0000 mL | Freq: Once | INTRAVENOUS | Status: AC | PRN
  Administered 2020-10-09: 95 mL via INTRAVENOUS

## 2020-10-11 ENCOUNTER — Other Ambulatory Visit: Payer: Self-pay | Admitting: Cardiology

## 2020-10-11 ENCOUNTER — Ambulatory Visit (HOSPITAL_COMMUNITY)
Admission: RE | Admit: 2020-10-11 | Discharge: 2020-10-11 | Disposition: A | Discharge status: Home / Self Care | Payer: 59 | Source: Ambulatory Visit | Attending: Cardiology | Admitting: Cardiology

## 2020-10-11 DIAGNOSIS — R072 Precordial pain: Secondary | ICD-10-CM | POA: Diagnosis present

## 2020-10-11 DIAGNOSIS — R9431 Abnormal electrocardiogram [ECG] [EKG]: Secondary | ICD-10-CM | POA: Insufficient documentation

## 2020-10-11 DIAGNOSIS — R931 Abnormal findings on diagnostic imaging of heart and coronary circulation: Secondary | ICD-10-CM

## 2020-10-24 ENCOUNTER — Encounter: Payer: Self-pay | Admitting: Cardiology

## 2020-10-24 ENCOUNTER — Other Ambulatory Visit: Payer: Self-pay

## 2020-10-24 ENCOUNTER — Ambulatory Visit: Payer: 59 | Admitting: Cardiology

## 2020-10-24 VITALS — BP 127/79 | HR 51 | Temp 98.3°F | Resp 16 | Ht 74.0 in | Wt 225.0 lb

## 2020-10-24 DIAGNOSIS — Z8616 Personal history of COVID-19: Secondary | ICD-10-CM

## 2020-10-24 DIAGNOSIS — R9431 Abnormal electrocardiogram [ECG] [EKG]: Secondary | ICD-10-CM

## 2020-10-24 DIAGNOSIS — I10 Essential (primary) hypertension: Secondary | ICD-10-CM

## 2020-10-24 DIAGNOSIS — D860 Sarcoidosis of lung: Secondary | ICD-10-CM

## 2020-10-24 DIAGNOSIS — I700 Atherosclerosis of aorta: Secondary | ICD-10-CM

## 2020-10-24 DIAGNOSIS — R931 Abnormal findings on diagnostic imaging of heart and coronary circulation: Secondary | ICD-10-CM

## 2020-10-24 DIAGNOSIS — R001 Bradycardia, unspecified: Secondary | ICD-10-CM

## 2020-10-24 MED ORDER — ASPIRIN EC 81 MG PO TBEC: 81.0000 mg | DELAYED_RELEASE_TABLET | Freq: Every day | ORAL | 11 refills | Status: DC

## 2020-10-24 MED ORDER — ATORVASTATIN CALCIUM 10 MG PO TABS
10.0000 mg | ORAL_TABLET | Freq: Every day | ORAL | 0 refills | Status: DC
Start: 2020-10-24 — End: 2021-03-15

## 2020-10-24 NOTE — Progress Notes (Signed)
ID:  Andrew Logan, DOB 17-Feb-1956, MRN [id number]  PCP:  Andrew Landsman, MD  Cardiologist:  Andrew Kras, DO, [facility] (established care 08/17/2019) Former Cardiology Providers: None  Date: 10/24/20 Last Office Visit: 09/25/2020  Chief Complaint  Patient presents with   Precordial chest pain   Results   Follow-up    HPI  Andrew Logan is a 65 y.o. male who presents to the office with a chief complaint of " reevaluation of chest pain and discuss test results." Patient's past medical history and cardiovascular risk factors include: Mild coronary artery calcification (23.4 AU, 60th percentile), mild nonobstructive CAD, aortic atherosclerosis, Hx of Covid 19 infection, Hypertension, pulmonary sarcoidosis, diastolic dysfunction, obesity due to excess calories.  Sarcoidosis of lung was diagnosed in 1998 and has had flare up in the past (I.e. 2003 and 2006). He use to follow up with pulmonary medicine last evaluation was approximately 2 years ago.  During last office visit patient presented to the clinic sooner than his 1 year office visit complaining of chest pain.  The symptoms were not classic of anginal discomfort but given his risk factors patient chose to proceed with additional testing to evaluate for CAD.  Since last office visit patient underwent coronary CTA results including the images reviewed with the patient at today's office visit.  Patient denies any chest pain at rest or with effort related activities.  Patient states that his blood pressures have improved significantly since last office encounter as well.  He is overall euvolemic and not in congestive heart failure.  No hospitalizations or urgent care visits for cardiovascular symptoms since last encounter.   FUNCTIONAL STATUS: Works out on treadmill two days a week for atleast 32 minutes each time.    ALLERGIES: No Known Allergies  MEDICATION LIST PRIOR TO VISIT: Current Meds  Medication Sig   amLODipine (NORVASC) 5 MG  tablet Take 1 tablet (5 mg total) by mouth daily.   aspirin EC 81 MG tablet Take 1 tablet (81 mg total) by mouth daily. Swallow whole.   atorvastatin (LIPITOR) 10 MG tablet Take 1 tablet (10 mg total) by mouth at bedtime.   Cholecalciferol (VITAMIN D PO) Take 1 capsule by mouth daily.   Cyanocobalamin (B-12 PO) Take 1 tablet by mouth daily.   famotidine (PEPCID) 20 MG tablet One at bedtime (Patient taking differently: Take 20 mg by mouth as needed. One at bedtime)   fluticasone (FLONASE) 50 MCG/ACT nasal spray Place 2 sprays into both nostrils daily as needed for allergies or rhinitis.   LUMIGAN 0.01 % SOLN Place 1 drop into both eyes at bedtime.   Multiple Vitamins-Minerals (MENS 50+ ADVANCED PO) Take 1 capsule by mouth daily.   TURMERIC PO Take 1 capsule by mouth daily.   zolpidem (AMBIEN) 10 MG tablet Take 10 mg by mouth at bedtime as needed.     PAST MEDICAL HISTORY: Past Medical History:  Diagnosis Date   Cataract, nuclear sclerotic, right eye 07/19/2020   The nature of cataract was discussed with the patient as well as the elective nature of surgery. The patient was reassured that surgery at a later date does not put the patient at risk for a worse outcome. It was emphasized that the need for surgery is dictated by the patient's quality of life as influenced by the cataract. 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  SOCIAL HISTORY:  The patient  reports that he has never  smoked. He has never used smokeless tobacco. He reports that he does not drink alcohol and does not use drugs.  REVIEW OF SYSTEMS: Review of Systems  Constitutional: Negative for chills and fever.  HENT:  Negative for hoarse voice and nosebleeds.   Eyes:  Negative for discharge, double vision and pain.  Cardiovascular:  Negative for chest pain, claudication, dyspnea on exertion, irregular heartbeat, leg swelling, near-syncope, orthopnea, palpitations, paroxysmal nocturnal dyspnea and syncope.  Respiratory:  Negative for hemoptysis and shortness of breath.   Musculoskeletal:  Negative for muscle cramps and myalgias.  Gastrointestinal:  Negative for abdominal pain, constipation, diarrhea, hematemesis, hematochezia, melena, nausea and vomiting.  Neurological:  Negative for dizziness and light-headedness.   PHYSICAL EXAM: Vitals with BMI 10/24/2020 10/09/2020 10/09/2020  Height '6\' 2"'  - -  Weight 225 lbs - -  BMI 94.85 - -  Systolic 462 703 500  Diastolic 79 79 86  Pulse 51 54 46   CONSTITUTIONAL: Well-developed and well-nourished. No acute distress.  SKIN: Skin is warm and dry. No rash noted. No cyanosis. No pallor. No jaundice HEAD: Normocephalic and atraumatic.  EYES: No scleral icterus MOUTH/THROAT: Moist oral membranes.  NECK: No JVD present. No thyromegaly noted. No carotid bruits  LYMPHATIC: No visible cervical adenopathy.  CHEST Normal respiratory effort. No intercostal retractions  LUNGS: Clear to auscultation bilaterally.  No stridor. No wheezes. No rales.  CARDIOVASCULAR: Regular rate and rhythm, positive S1-S2, no murmurs rubs or gallops appreciated. ABDOMINAL: Soft, nontender, nondistended, positive bowel sounds in all 4 quadrants.  No apparent ascites.  EXTREMITIES: Trace bilateral peripheral edema  HEMATOLOGIC: No significant bruising NEUROLOGIC: Oriented to person, place, and time. Nonfocal. Normal muscle tone.  PSYCHIATRIC: Normal mood and affect. Normal behavior.  Cooperative  CARDIAC DATABASE: EKG: 09/25/2020: Sinus bradycardia, 47 bpm, left axis, nonspecific T wave abnormality, without underlying injury pattern.  Echocardiogram: 08/30/2019: LVEF 60%, moderate LVH, grade 2 diastolic impairment, elevated LAP, mildly dilated left atrium, trace AR, mild MR.  Stress Testing: Lexiscan Sestamibi stress test 09/06/2019: Lexiscan nuclear stress test performed using 1-day protocol. Normal myocardial perfusion. Stress LVEF 65%. Low risk study.  Heart Catheterization: None  CCTA 10/09/2020: Total coronary calcium score of 23.4. This was 60th percentile for age and sex matched control. Normal coronary origin with right dominance.  CAD-RADS = 2 Mild non-obstructive CAD. Mild stenosis (25-49%) due to soft plaque within the proximal/mid RCA.  No significant stenosis within the left main, LAD, or LCx. Aortic atherosclerosis. CT FFR analysis showed no significant stenosis. No acute extra cardiac abnormality.  LABORATORY DATA: CBC Latest Ref Rng & Units 03/04/2017 09/28/2016  WBC 4.0 - 10.5 K/uL 6.1 6.1  Hemoglobin 13.0 - 17.0 g/dL 13.0 13.6  Hematocrit 39.0 - 52.0 % 42.1 43.6  Platelets 150.0 - 400.0 K/uL 222.0 188    CMP Latest Ref Rng & Units 09/25/2020 11/03/2019 09/28/2019  Glucose 65 - 99 mg/dL 93 80 87  BUN 8 - 27 mg/dL '11 13 14  ' Creatinine 0.76 - 1.27  mg/dL 1.50(H) 1.51(H) 1.45(H)  Sodium 134 - 144 mmol/L 139 141 138  Potassium 3.5 - 5.2 mmol/L 4.4 4.8 4.7  Chloride 96 - 106 mmol/L 99 102 100  CO2 20 - 29 mmol/L '24 27 20  ' Calcium 8.6 - 10.2 mg/dL 9.7 9.5 10.0    Lipid Panel  No results found for: CHOL, TRIG, HDL, CHOLHDL, VLDL, LDLCALC, LDLDIRECT, LABVLDL  No components found for: NTPROBNP No results for input(s): PROBNP in the last 8760 hours. No results for input(s): TSH in the last 8760 hours.  BMP Recent Labs    11/03/19 1523 09/25/20 1602  NA 141 139  K 4.8 4.4  CL 102 99  CO2 27 24  GLUCOSE 80 93  BUN 13 11  CREATININE 1.51*  1.50*  CALCIUM 9.5 9.7  GFRNONAA 48*  --   GFRAA 56*  --     HEMOGLOBIN A1C No results found for: HGBA1C, MPG  IMPRESSION:    ICD-10-CM   1. Agatston coronary artery calcium score less than 100  R93.1 aspirin EC 81 MG tablet    atorvastatin (LIPITOR) 10 MG tablet    Lipid Panel With LDL/HDL Ratio    LDL cholesterol, direct    CMP14+EGFR    2. Bradycardia  R00.1     3. Benign hypertension  I10     4. History of COVID-19  Z86.16     5. Pulmonary sarcoidosis ([place])  D86.0     6. Nonspecific abnormal electrocardiogram (ECG) (EKG)  R94.31        RECOMMENDATIONS: Andrew Logan is a 65 y.o. male whose past medical history and cardiac risk factors include: Mild coronary artery calcification (23.4 AU, 60th percentile), mild nonobstructive CAD, aortic atherosclerosis, Hx of Covid 19 infection, Hypertension, pulmonary sarcoidosis, diastolic dysfunction, obesity due to excess calories.  Atherosclerosis of the coronary arteries due to calcified lesion: Start aspirin 81 mg p.o. daily. Check fasting lipid profile and CMP. Start atorvastatin 10 mg p.o. nightly.   Educated on the importance of secondary prevention.  Atherosclerosis of the aorta: As discussed above aspirin and statin therapy.  Benign essential hypertension: At the last office visit had discontinued diltiazem due to asymptomatic bradycardia. Started him on amlodipine 5 mg p.o. daily. Blood pressures have improved significantly since that intervention Low-salt diet recommended. Encouraged 30 minutes of moderate intensity exercise 5 days a week.  Pulmonary sarcoidosis: Patient is asked to follow-up with the pulmonologist as per his PCPs recommendation.  FINAL MEDICATION LIST END OF ENCOUNTER: Meds ordered this encounter  Medications   aspirin EC 81 MG tablet    Sig: Take 1 tablet (81 mg total) by mouth daily. Swallow whole.    Dispense:  30 tablet    Refill:  11   atorvastatin (LIPITOR) 10 MG tablet    Sig: Take 1  tablet (10 mg total) by mouth at bedtime.    Dispense:  90 tablet    Refill:  0      Current Outpatient Medications:    amLODipine (NORVASC) 5 MG tablet, Take 1 tablet (5 mg total) by mouth daily., Disp: 90 tablet, Rfl: 0   aspirin EC 81 MG tablet, Take 1 tablet (81 mg total) by mouth daily. Swallow whole., Disp: 30 tablet, Rfl: 11   atorvastatin (LIPITOR) 10 MG tablet, Take 1 tablet (10 mg total) by mouth at bedtime., Disp: 90 tablet, Rfl: 0   Cholecalciferol (VITAMIN D PO), Take 1 capsule by mouth daily., Disp: , Rfl:    Cyanocobalamin (B-12 PO),  Take 1 tablet by mouth daily., Disp: , Rfl:    famotidine (PEPCID) 20 MG tablet, One at bedtime (Patient taking differently: Take 20 mg by mouth as needed. One at bedtime), Disp: , Rfl:    fluticasone (FLONASE) 50 MCG/ACT nasal spray, Place 2 sprays into both nostrils daily as needed for allergies or rhinitis., Disp: , Rfl:    LUMIGAN 0.01 % SOLN, Place 1 drop into both eyes at bedtime., Disp: , Rfl:    Multiple Vitamins-Minerals (MENS 50+ ADVANCED PO), Take 1 capsule by mouth daily., Disp: , Rfl:    TURMERIC PO, Take 1 capsule by mouth daily., Disp: , Rfl:    zolpidem (AMBIEN) 10 MG tablet, Take 10 mg by mouth at bedtime as needed., Disp: , Rfl:   Orders Placed This Encounter  Procedures   Lipid Panel With LDL/HDL Ratio   LDL cholesterol, direct   CMP14+EGFR    There are no Patient Instructions on file for this visit.   --Continue cardiac medications as reconciled in final medication list. --Return in about 3 months (around 01/23/2021) for Follow up, Coronary artery calcification, lipids before next visit. . Or sooner if needed. --Continue follow-up with your primary care physician regarding the management of your other chronic comorbid conditions.  Patient's questions and concerns were addressed to his satisfaction. He voices understanding of the instructions provided during this encounter.   This note was created using a voice  recognition software as a result there may be grammatical errors inadvertently enclosed that do not reflect the nature of this encounter. Every attempt is made to correct such errors.  Andrew Logan, [place], [facility]  Pager: [phone] Office: [phone]

## 2020-10-25 ENCOUNTER — Encounter (INDEPENDENT_AMBULATORY_CARE_PROVIDER_SITE_OTHER): Payer: 59 | Admitting: Ophthalmology

## 2020-11-06 ENCOUNTER — Ambulatory Visit: Payer: 59 | Admitting: Cardiology

## 2020-11-14 ENCOUNTER — Ambulatory Visit: Payer: 59 | Admitting: Cardiology

## 2020-11-29 ENCOUNTER — Telehealth: Payer: Self-pay

## 2020-11-29 DIAGNOSIS — I10 Essential (primary) hypertension: Secondary | ICD-10-CM

## 2020-11-29 MED ORDER — AMLODIPINE BESYLATE 5 MG PO TABS: 5.0000 mg | ORAL_TABLET | Freq: Every day | ORAL | 0 refills | Status: DC

## 2020-11-30 NOTE — Telephone Encounter (Signed)
Error

## 2020-12-04 ENCOUNTER — Other Ambulatory Visit: Payer: Self-pay

## 2020-12-04 DIAGNOSIS — I10 Essential (primary) hypertension: Secondary | ICD-10-CM

## 2020-12-04 MED ORDER — AMLODIPINE BESYLATE 5 MG PO TABS: 5.0000 mg | ORAL_TABLET | Freq: Every day | ORAL | 0 refills | Status: AC

## 2020-12-13 ENCOUNTER — Other Ambulatory Visit: Payer: Self-pay

## 2020-12-13 DIAGNOSIS — R931 Abnormal findings on diagnostic imaging of heart and coronary circulation: Secondary | ICD-10-CM

## 2020-12-13 MED ORDER — ASPIRIN EC 81 MG PO TBEC: 81.0000 mg | DELAYED_RELEASE_TABLET | Freq: Every day | ORAL | 1 refills | Status: DC

## 2021-01-07 ENCOUNTER — Other Ambulatory Visit: Payer: Self-pay | Admitting: Cardiology

## 2021-01-22 ENCOUNTER — Ambulatory Visit: Payer: 59 | Admitting: Cardiology

## 2021-02-23 ENCOUNTER — Ambulatory Visit: Payer: 59 | Admitting: Cardiology

## 2021-03-15 ENCOUNTER — Other Ambulatory Visit: Payer: Self-pay | Admitting: Cardiology

## 2021-03-15 DIAGNOSIS — R931 Abnormal findings on diagnostic imaging of heart and coronary circulation: Secondary | ICD-10-CM

## 2021-03-29 ENCOUNTER — Other Ambulatory Visit: Payer: Self-pay | Admitting: Cardiology

## 2021-03-29 ENCOUNTER — Encounter (INDEPENDENT_AMBULATORY_CARE_PROVIDER_SITE_OTHER): Payer: 59 | Admitting: Ophthalmology

## 2021-03-29 DIAGNOSIS — R931 Abnormal findings on diagnostic imaging of heart and coronary circulation: Secondary | ICD-10-CM

## 2021-05-21 ENCOUNTER — Other Ambulatory Visit: Payer: Self-pay | Admitting: Cardiology

## 2021-05-21 DIAGNOSIS — R931 Abnormal findings on diagnostic imaging of heart and coronary circulation: Secondary | ICD-10-CM

## 2021-12-17 ENCOUNTER — Encounter (INDEPENDENT_AMBULATORY_CARE_PROVIDER_SITE_OTHER): Payer: Self-pay

## 2022-05-14 ENCOUNTER — Other Ambulatory Visit: Payer: Self-pay | Admitting: Cardiology

## 2022-05-14 DIAGNOSIS — R931 Abnormal findings on diagnostic imaging of heart and coronary circulation: Secondary | ICD-10-CM
